# Patient Record
Sex: Female | Born: 1945
Health system: Southern US, Community
[De-identification: ages and names within clinical notes are randomized; demographics above are authoritative.]

## PROBLEM LIST (undated history)

## (undated) DIAGNOSIS — F419 Anxiety disorder, unspecified: Secondary | ICD-10-CM

## (undated) DIAGNOSIS — I10 Essential (primary) hypertension: Secondary | ICD-10-CM

## (undated) DIAGNOSIS — K469 Unspecified abdominal hernia without obstruction or gangrene: Secondary | ICD-10-CM

## (undated) DIAGNOSIS — I2510 Atherosclerotic heart disease of native coronary artery without angina pectoris: Secondary | ICD-10-CM

## (undated) DIAGNOSIS — E119 Type 2 diabetes mellitus without complications: Secondary | ICD-10-CM

## (undated) DIAGNOSIS — E039 Hypothyroidism, unspecified: Secondary | ICD-10-CM

## (undated) HISTORY — PX: CERVICAL FUSION: SHX112

## (undated) HISTORY — DX: Type 2 diabetes mellitus without complications: E11.9

## (undated) HISTORY — DX: Atherosclerotic heart disease of native coronary artery without angina pectoris: I25.10

## (undated) HISTORY — PX: KNEE SURGERY: SHX244

## (undated) HISTORY — PX: CHOLECYSTECTOMY: SHX55

## (undated) HISTORY — DX: Unspecified abdominal hernia without obstruction or gangrene: K46.9

## (undated) HISTORY — PX: ABDOMINAL HYSTERECTOMY: SHX81

## (undated) HISTORY — PX: UPPER GASTROINTESTINAL ENDOSCOPY: SHX188

## (undated) HISTORY — DX: Essential (primary) hypertension: I10

## (undated) HISTORY — PX: COLONOSCOPY: SHX174

## (undated) HISTORY — DX: Anxiety disorder, unspecified: F41.9

---

## 2000-04-28 ENCOUNTER — Ambulatory Visit: Admission: RE | Admit: 2000-04-28 | Discharge: 2000-04-28 | Payer: Self-pay | Admitting: Family Medicine

## 2001-03-19 ENCOUNTER — Encounter: Payer: Self-pay | Admitting: Neurosurgery

## 2001-03-24 ENCOUNTER — Ambulatory Visit (HOSPITAL_COMMUNITY): Admission: RE | Admit: 2001-03-24 | Discharge: 2001-03-25 | Payer: Self-pay | Admitting: Neurosurgery

## 2001-03-24 ENCOUNTER — Encounter: Payer: Self-pay | Admitting: Neurosurgery

## 2001-05-18 ENCOUNTER — Encounter: Admission: RE | Admit: 2001-05-18 | Discharge: 2001-05-18 | Payer: Self-pay | Admitting: Neurosurgery

## 2001-05-18 ENCOUNTER — Encounter: Payer: Self-pay | Admitting: Neurosurgery

## 2005-03-11 ENCOUNTER — Ambulatory Visit: Payer: Self-pay | Admitting: Urgent Care

## 2005-03-12 ENCOUNTER — Ambulatory Visit (HOSPITAL_COMMUNITY): Admission: RE | Admit: 2005-03-12 | Discharge: 2005-03-12 | Payer: Self-pay | Admitting: Internal Medicine

## 2005-03-12 ENCOUNTER — Ambulatory Visit: Payer: Self-pay | Admitting: Internal Medicine

## 2005-05-01 ENCOUNTER — Ambulatory Visit: Payer: Self-pay | Admitting: Internal Medicine

## 2006-05-18 ENCOUNTER — Ambulatory Visit: Payer: Self-pay | Admitting: Internal Medicine

## 2006-07-01 ENCOUNTER — Ambulatory Visit: Payer: Self-pay | Admitting: Internal Medicine

## 2010-04-15 ENCOUNTER — Ambulatory Visit (INDEPENDENT_AMBULATORY_CARE_PROVIDER_SITE_OTHER): Payer: Self-pay | Admitting: Internal Medicine

## 2010-06-04 NOTE — Assessment & Plan Note (Signed)
Melanie Mathews, ENGEBRETSEN                CHART#:  [id number]   DATE:  07/01/2006                       DOB:  [birth year]   PRESENTING COMPLAINT:  Persistent anorectal pain.   SUBJECTIVE:  The patient is a 65 year old Caucasian female who was seen  in our office on 05/18/06. She is noted to have sentinel skin tag and  inflammation of the anal canal and there was question of anal fissure.  She was given AnaMantle HC Forte and nitroglycerin. She says the  AnaMantle has helped her but nitroglycerin did not. She is only using  AnaMantle at bedtime. She says she is only slightly better. She has  aching most of the time but she has excruciating pain when her bowels  move and it may last for several minutes to a couple of hours. She still  notices a small amount of fresh blood with her bowel movements. However,  she has not had diarrhea lately. Similarly, she denies constipation. She  states her heartburn is well controlled with therapy. However, if she  misses a dose she has bad heartburn same day.   MEDICATIONS:  1. She is on Byetta 10 mcg subcutaneously b.i.d.  2. Metformin 1 gram b.i.d.  3. Metoclopramide 4 mg ACN.  4. Dyazide 1 daily.  5. Levoxyl 112 mcg daily.  6. Levbid 1 tablet q.a.m.  7. Metoprolol XL 100 mg daily.  8. Simvastatin 80 mg daily.  9. Effexor XR 150 mg daily.  10.Klor-Con 10 mEq daily.  11.Nexium 40 mg q.a.m.  12.Ramipril 5 mg daily.  13.Centrum Silver daily.  14.Calcium 600 mg daily.  15.AnaMantle HC per rectum at bedtime.  16.Nitroglycerin 0.2% p.r.n.   OBJECTIVE:  VITAL SIGNS: Weight 229.5 pounds. She is 5 feet 7.5 inches  tall, pulse 88 per minute, blood pressure 120/80, temp is 98.7.  HEENT: Conjunctivae are pink. Sclerae nonicteric. Oropharyngeal mucosa  is normal.  NECK: No neck masses are noted.  ABDOMEN: The abdomen is protuberant but soft and nontender with no  organomegaly or masses.  RECTAL: Examination was limited to external inspection. She has  sentinel  tag interiorly. There is a small sentinel skin tag posteriorly and there  is questionable ulcer above that. This area was difficult to see on  direct exam.   ASSESSMENT:  1. Anorectal discomfort. Her symptoms are typical of a fissure in ano.      She can try symptomatic therapy for another 2 weeks. If she does      not get better I am afraid she will need exam under anesthesia,      internal sphincterotomy.  2. Chronic gastroesophageal reflux disease. She is doing well with      therapy.   PLAN:  1. New prescription given for AnaMantle [facility] Forte that she applies once      or twice daily. She will also try sitz baths and make sure that her      stools are soft.  2. If she does not feel better in the next 2 weeks, she will make an      appointment to see Dr. Cleotis Nipper.  3. She will continue her Nexium as before, but she can discontinue      nitro ointment.       Lionel December, M.D.  Electronically Signed     NR/MEDQ  D:  07/01/2006  T:  07/02/2006  Job:  [id number]   cc:   Vangie Bicker. Cleotis Nipper, M.D.

## 2010-06-07 NOTE — Op Note (Signed)
[name]  Patient:    Melanie Mathews, Melanie Mathews Visit Number: [id number] MRN: [id number]          Service Type: DSU Location: [facility] 3172 [id number] Attending Physician:  Donn Pierini Dictated by:   Julio Sicks, M.D. Proc. Date: 03/24/01 Admit Date:  03/24/2001                             Operative Report  PREOPERATIVE DIAGNOSIS:  C6-7 central herniated nucleus pulposus with stenosis and myelopathy.  POSTOPERATIVE DIAGNOSIS:  C6-7 central herniated nucleus pulposus with stenosis and myelopathy.  OPERATION PERFORMED:  C6-7 anterior cervical diskectomy and fusion with allograft and anterior plating.  SURGEON:  Julio Sicks, M.D.  ASSISTANT:  Donalee Citrin, Montez Hageman.  ANESTHESIA:  General endotracheal.  INDICATIONS FOR PROCEDURE:  The patient is a 65 year old female with a history of bilateral upper extremity pain, paresthesias and weakness with failure of conservative management.  These symptoms are consistent with early cervical myelopathy.  She also has some degree of coexistent carpal tunnel syndrome. MRI scan demonstrates a central disk herniation at C6-7 with spinal cord compression.  We discussed the options available for management including the possibility of undergoing a C6-7 anterior cervical diskectomy and fusion with allograft and anterior plating for hopeful improvement in her symptoms.  She realizes that at a later time her carpal tunnel syndrome may also need to be addressed.  DESCRIPTION OF PROCEDURE:  The patient was taken to the operating room and placed on the operating table in supine position.  After an adequate level of anesthesia was achieved, the patient was positioned supine with the neck slightly extended and held in place with halter traction.  The patients anterior cervical region was shaved and prepped sterilely.  A 10 blade was used to make a linear incision overlying the C6-7 level.  This was carried down sharply to the platysma.   The platysma was then divided vertically and dissection proceeded along the medial border of the sternocleidomastoid muscle and carotid sheath.  The trachea and esophagus were mobilized and retracted towards the left.  Prevertebral fascia was stripped off the anterior spinal column.  The longus colli muscles were then elevated bilaterally using electrocautery.  Deep self-retaining retractor was placed.  Intraoperative fluoroscopy was used and the C6-7 level was confirmed.  The disk space was then incised with a 15 blade in a rectangular fashion.  A wide disk space clean-out was then achieved using pituitary rongeurs, forward and backward angled Carlens curets, Kerrison rongeurs and a high speed drill.  All elements of the disk were removed down to the posterior annulus.  Microscope was brought into the field and used throughout the remainder of the diskectomy. The remaining aspects of annulus and osteophytes were removed down to the level of the posterior longitudinal ligament.  The posterior longitudinal ligament was then elevated and resected in piecemeal fashion using Kerrison rongeurs.  The underlying thecal sac was identified.  A wide central decompression was then performed using Kerrison rongeurs to undercut the bodies of C6 and C7 and completely remove all elements of the disk to decompress the spinal cord.  Decompression was then proceeded out to the left sided C7 foramen.  The C7 nerve root was identified proximally and found to be free of any compression.  Decompression then proceeded out to the right sided C7 foramen.  Once again the C7 nerve root was identified proximally and decompressed within its  foramen.  At this point a blunt probe was passed easily both superiorly and inferiorly out each foramen.  There was no evidence of any residual stenosis.  The wound was then irrigated with antibiotic solution.  Gelfoam was placed topically for hemostasis which was found to be good.   An 8 mm patellar wedge allograft was impacted into place and recessed approximately 1 mm from the anterior cortical surface.  A 25 mm Atlantis anterior cervical plate was then placed over the C6 and C7 levels.  It was then attached under fluoroscopic guidance by using two 13 mm variable angle screws at both C6 and at C7.  All four screws were given a final tightening and found to be solidly within bone.  Locking screws were engaged at both levels.  Final images revealed good position of the bone grafts and hardware at the proper operative level with normal alignment of the spine.  Retraction system was removed.  Hemostasis was achieved with bipolar electrocautery.  The wound was then closed in a typical fashion.  Steri-Strips and dry sterile dressing were applied.  There were no apparent complications.   The patient tolerated the procedure well and she returned to the recovery room postoperatively. Dictated by:   Julio Sicks, M.D. Attending Physician:  Donn Pierini DD:  03/24/01 TD:  03/24/01 Job: [id number]

## 2010-06-07 NOTE — Consult Note (Signed)
NAME:  Melanie Mathews, Melanie Mathews               ACCOUNT NO.:  [id number]   MEDICAL RECORD NO.:  [id number]          PATIENT TYPE:  AMB   LOCATION:  DAY                           FACILITY:  [facility]   PHYSICIAN:  Lionel December, M.D.    DATE OF BIRTH:  27-Feb-1945   DATE OF CONSULTATION:  DATE OF DISCHARGE:                                   CONSULTATION   GASTROENTEROLOGY CONSULTATION   REFERRING PHYSICIAN:  Dr. Dimas Aguas.   REASON FOR CONSULTATION:  Chronic refractory gastroesophageal reflux type  symptoms.   HISTORY OF PRESENT ILLNESS:  [name] is a 65 year old Caucasian female  who was recently worked up for left upper anterior chest pain that radiated  down her left arm. She was seen in the emergency room. She was diagnosed  with chest wall pain.  She continues to complain of intermittent aching  which usually lasts for several hours.  Movement definitely makes the pain  worse.  She also has a history of chronic gastroesophageal reflux disease.  She has been on Nexium for years. She has break through heartburn. She does  have daily nausea and vomiting, usually worse in the evening.  She is  complaining of a significant amount of water brash as well as a thick mucous  at the back of her throat. She is having some choking with liquids.  She  denies any solid food dysphagia. She feels that she does have pain or spasms  in her mid esophagus while swallowing liquids. She denies any anorexia,  denies any early satiety. She has a history of irritable bowel syndrome. She  takes Levbid daily. She generally has diarrhea-predominant symptoms.  She  has generally around 2 to 3 bowel movements a day. She complains of  proctalgia. She has a history of rectal fissures.  She did have normal  colonoscopy by Dr. Cleotis Nipper in October 2006.  She states she has been  exposed to Clostridium difficile through her Mom at the nursing home,  however, she notes her diarrhea is no more significant than it usually is.   PAST MEDICAL HISTORY:  Hypothyroidism, chronic GERD, depression,  hypertension, hypercholesterolemia, irritable bowel syndrome, diabetes  mellitus, rectal fissure, diverticulitis in fall 2006, colonoscopy by Dr.  Cleotis Nipper in October 2006 which showed diverticulosis per patient,  cholecystectomy five years ago, complete hysterectomy, cervical disk surgery  and hemorrhoidectomy.  Patient had esophagogastroduodenoscopy by Dr. Karilyn Cota  on August 30, 1997 and had a moderate sized sliding hiatal hernia with  changes of reflux esophagitis limited to the herniated part of the stomach,  a small gastric polyp, antral gastritis and a prominent EG junction which  was biopsied at four points in case she had an inconspicuous ring.  Gastric  biopsy showed mild chronic gastritis.   CURRENT MEDICATIONS:  1.  Levoxyl 112 mcg daily.  2.  Levbid one-half to one p.o. daily, PRN.  3.  K-Dur 10 mEq daily.  4.  Altace 5 mg daily.  5.  Effexor XR 75 mg daily.  6.  Zocor 80 mg daily.  7.  Nexium 40 mg daily.  8.  Toprol XL 100 mg daily.  9.  Metformin 1,000 mg b.i.d.  10. Maxzide/ hydrochlorothiazide 25/37.5 mg daily.   ALLERGIES:  SULFA.   FAMILY HISTORY:  Positive for father deceased at age 56 with pancreatic  carcinoma.  Mother age 71 is relatively healthy. She has multiple siblings,  all of whom are relatively healthy.   SOCIAL HISTORY:  [name] has been married for 38 years. She has two  grown healthy children.  She is employed full time with [employer].  She has a  remote tobacco use history. She denies alcohol or drug use.   REVIEW OF SYSTEMS:  CONSTITUTIONAL:  Her weight is steadily increasing.  Denies any fever or chills.  She does complain of hot flashes occasionally.  CARDIOVASCULAR:  See history of present illness.  PULMONARY:  Denies any  shortness of breath, dyspnea, cough, hemoptysis  GASTROINTESTINAL:  See  history of present illness.  She denies any rectal bleeding or melena.   ENDOCRINE:  She has history of diabetes mellitus.  She had a TSH checked in  January given history of hypothyroidism, which was normal.   PHYSICAL EXAMINATION:  VITAL SIGNS:  Weight 267 pounds, height 67-1/2  inches. Temperature 98.6, blood pressure 142/80, pulse 80.  GENERAL APPEARANCE:  [name] is a 65 year old obese Caucasian female who  is alert, oriented, pleasant and cooperative and in no acute distress.  HEENT:  Sclerae clear, nonicteric.  Conjunctiva pink.  Oropharynx pink and  moist without any lesions.  NECK: Supple without masses or thyromegaly.  CHEST:  Heart regular rate and rhythm with normal S1, S2, without any  murmurs, clicks, rubs or gallops.  Her upper anterior chest is nontender to  palpation.  ABDOMEN:  Positive bowel sounds X4.  No bruits auscultated.  Soft,  nondistended. She does have mild right upper quadrant tenderness on deep  palpation.  No rebound tenderness or guarding.  She has right upper quadrant  scars suggestive of previous cholecystectomy.  EXTREMITIES:  With trace lower extremity edema bilateral.  She does have  pain with left shoulder rotation and when she has adduction of the left  shoulder anteriorly. Left and right shoulder movement reproduces her pain.  Her upper anterior chest is nontender to palpation.  SKIN:  Her skin is pink, warm and dry without any rashes or jaundice.   IMPRESSION:  [name] is a 65 year old Caucasian female with history of  chronic gastroesophageal reflux disease. She has been on Nexium for several  years.  She has had some refractory heartburn, nausea, vomiting, water brash  lately. She has also had mid esophageal dysphagia with liquids and points to  her mid esophageal region.  She is having frequent choking spells.  She also  had some left upper anterior chest pain which radiates down her left arm.  The pain is reproduced with range of motion of the left shoulder.  I suspect she may have musculoskeletal  component to this pain, however, she definitely  has refractory gastroesophageal reflux disease symptoms as well.  Therefore,  I feel further evaluation is necessary to rule out erosive esophagitis and  complicating gastroesophageal reflux disease including the development of  web, ring or stricture.  She has history of irritable bowel syndrome which  is well controlled at this time.  She also has history of diverticulosis and  has recovered well from a bout of diverticulitis last fall.   PLAN:  1.  Esophagogastroduodenoscopy with possible esophageal dilatation by Dr.  Rehman in the near future.  I have discussed this procedure including      risks and benefits which include, but are not limited to, bleeding,      infection, perforation, drug reaction.  She agrees with this plan and      consent will be obtained.  2.  Will obtain colonoscopy report from Dr. Verne Grain office from October      of 2006.  3.  Benefiber samples given.  4.  She is to continue Nexium 40 mg daily or b.i.d. if needed.  5.  I have offered pain medications for her chest pain.  She has declined.      In the meantime she is going to take Tylenol Arthritis.   We would like to thank Dr. Dimas Aguas for allowing [place] to participate in the care  of Ms. Clapper.      Nicholas Lose, N.P.      Lionel December, M.D.  Electronically Signed    KC/MEDQ  D:  03/12/2005  T:  03/12/2005  Job:  [id number]   cc:   Dr. Dimas Aguas

## 2010-06-07 NOTE — Op Note (Signed)
Melanie Mathews, Melanie Mathews               ACCOUNT NO.:  [id number]   MEDICAL RECORD NO.:  [id number]          PATIENT TYPE:  AMB   LOCATION:  DAY                           FACILITY:  [facility]   PHYSICIAN:  Lionel December, M.D.    DATE OF BIRTH:  02-19-1945   DATE OF PROCEDURE:  03/12/2005  DATE OF DISCHARGE:                                 OPERATIVE REPORT   PROCEDURE:  Esophagogastroduodenoscopy with esophageal dilation.   INDICATIONS:  [name] is a 65 year old Caucasian female with chronic GERD who  is having dysphagia to solids as well as liquids. She is undergoing  diagnostic and therapeutic procedure. Procedure and risks were reviewed with  the patient, and informed consent was obtained.   MEDICINES FOR CONSCIOUS SEDATION:  Cetacaine spray for oropharyngeal topical  anesthesia Demerol 50 mg IV Versed 8 mg IV.   FINDINGS:  Procedure performed in endoscopy suite. The patient's vital signs  and O2 saturations were monitored during the procedure and remained stable.  The patient was placed in the left lateral recumbent position and the  Olympus videoscope was passed via oropharynx without any difficulty into  esophagus.   ESOPHAGUS:  Mucosa of the esophagus was normal throughout. The proximal  segment was carefully examined and did not see Zenker's diverticulum. GE  junction was at 40 cm from the incisors, but there was no ring or stricture  noted.   STOMACH:  Multiple polyps were noted at gastric body. The largest of which  was about 10 mm. Endoscopically these were suspicious for hyperplastic  polyps. Biopsy was taken from 4 of these and submitted in 1 container.  Antral mucosa revealed erythema and granularity but no erosions or ulcers  were noted. Pyloric channel as well as angularis, fundus, and cardia were  examined by retroflexing scope and were normal.   DUODENUM:  Bulbar mucosa was normal. Scope was passed into the second part  of duodenum where the mucosa and folds were normal.  Endoscope was withdrawn.   Esophagus was dilated by passing 56-French Maloney dilator to full  insertion. As the dilator was withdrawn, the endoscope was passed, again,  and no mucosal disruption was noted in the esophagus. Endoscope was  withdrawn. The patient tolerated the procedure well.   FINAL DIAGNOSIS:  1.  No evidence of esophagitis, ring or stricture noted, but esophagus      dilated by passing 56-French Maloney dilator given history of dysphagia.  2.  Multiple polyps at gastric body some of which were biopsied for      histology.  3.  Nonerosive antral gastritis.   RECOMMENDATIONS:  1.  Antireflux measures reinforced. Will increase her Nexium to 40 mg p.o.      b.i.d. She will pick up samples from      the office to supplement her prescription.  2.  H. pylori serology.  3.  I will be contacting the patient with results of biopsy and H. pylori;      and with further recommendations, if any.      Lionel December, M.D.  Electronically Signed     NR/MEDQ  D:  03/13/2005  T:  03/13/2005  Job:  [id number]   cc:   Selinda Flavin  Fax: [phone]

## 2011-04-16 DIAGNOSIS — R0602 Shortness of breath: Secondary | ICD-10-CM

## 2011-05-15 ENCOUNTER — Other Ambulatory Visit (INDEPENDENT_AMBULATORY_CARE_PROVIDER_SITE_OTHER): Payer: Self-pay | Admitting: Internal Medicine

## 2011-06-09 ENCOUNTER — Encounter (INDEPENDENT_AMBULATORY_CARE_PROVIDER_SITE_OTHER): Payer: Self-pay | Admitting: *Deleted

## 2011-06-09 ENCOUNTER — Encounter (INDEPENDENT_AMBULATORY_CARE_PROVIDER_SITE_OTHER): Payer: Self-pay | Admitting: Internal Medicine

## 2011-06-09 ENCOUNTER — Ambulatory Visit (INDEPENDENT_AMBULATORY_CARE_PROVIDER_SITE_OTHER): Payer: BC Managed Care – PPO | Admitting: Internal Medicine

## 2011-06-09 VITALS — BP 128/76 | HR 80 | Temp 98.8°F | Resp 20 | Ht 67.0 in | Wt 249.2 lb

## 2011-06-09 DIAGNOSIS — I10 Essential (primary) hypertension: Secondary | ICD-10-CM | POA: Insufficient documentation

## 2011-06-09 DIAGNOSIS — K219 Gastro-esophageal reflux disease without esophagitis: Secondary | ICD-10-CM | POA: Insufficient documentation

## 2011-06-09 DIAGNOSIS — K921 Melena: Secondary | ICD-10-CM

## 2011-06-09 DIAGNOSIS — K602 Anal fissure, unspecified: Secondary | ICD-10-CM | POA: Insufficient documentation

## 2011-06-09 DIAGNOSIS — E119 Type 2 diabetes mellitus without complications: Secondary | ICD-10-CM | POA: Insufficient documentation

## 2011-06-09 DIAGNOSIS — E785 Hyperlipidemia, unspecified: Secondary | ICD-10-CM | POA: Insufficient documentation

## 2011-06-09 DIAGNOSIS — R1319 Other dysphagia: Secondary | ICD-10-CM | POA: Insufficient documentation

## 2011-06-09 DIAGNOSIS — E1169 Type 2 diabetes mellitus with other specified complication: Secondary | ICD-10-CM | POA: Insufficient documentation

## 2011-06-09 MED ORDER — NITROGLYCERIN 0.4 % RE OINT: 1.0000 [in_us] | TOPICAL_OINTMENT | Freq: Two times a day (BID) | RECTAL | Status: DC

## 2011-06-09 MED ORDER — NYSTATIN-TRIAMCINOLONE 100000-0.1 UNIT/GM-% EX CREA: TOPICAL_CREAM | Freq: Two times a day (BID) | CUTANEOUS | Status: AC

## 2011-06-09 NOTE — Patient Instructions (Signed)
Colonoscopy to be scheduled. Notify if you have any side effects with nitroglycerin ointment.

## 2011-06-09 NOTE — Progress Notes (Signed)
PRESENTING COMPLAINT: Dysphagia, rectal pain, and hematochezia.  HISTORY OF PRESENT ILLNESS: Melanie Mathews is 66 year old Caucasian female  patient of Dr. Selinda Flavin, who is here for scheduled visit. She was  last seen by me in [place] office in February 2011. She had an office  visit last year, but had to be canceled because of family emergency.  She continues to experience dysphagia primarily to solids. She has  sought of learnt to live with it. Food bolus always passes down. She  has not had any episode of food impaction. She experiences heartburn no  more than once or twice a week with certain foods. However, if she  misses a pill, she experiences severe heartburn the same day. She has  been previously evaluated for this problem and felt to have motility  disorder. She declined to have manometry.  Today, she is also complaining of rectal pain. She describes this  fluctuating when her bowels move. She states she has to push on either  side of her anal opening in order to facilitate defecation. She feels  that she has obstruction in this area. She also has noted intermittent  hematochezia with passage of small to moderate amount of bright red  blood per rectum. She has history of diarrhea, but lately her stool has  been normal. She generally has 2 per day. Her appetite is normal. She  has gained 12 pounds since the last visit of February 2011. She denies  abdominal pain, nausea, vomiting, hoarseness, chronic cough, or sore  throat.  CURRENT MEDICATIONS:  1. Zetia 10 mg p.o. daily.  2. Furosemide 40 mg p.o. q.a.m.  3. Gabapentin 800 mg p.o. q.a.m. and 1.6 g p.o. at bedtime.  4. Levemir 35 units subcu in a.m. and 40 units in the p.m.  5. Levothyroxine 137 mcg p.o. daily.  6. Linagliptin and metformin 2.05/998 mg p.o. q.a.m.  7. Victoza 18 mg subcu at bedtime.  8. Metoprolol 50 mg p.o. daily.  9. MVI with minerals 1 daily.  10.Nexium 40 mg p.o. q.a.m.  11.Simvastatin 80 mg p.o. daily.    12.Micardis HCT 80/25 daily.  13.Ultracet 37.5/325 two tablets p.o. at bedtime.  14.Effexor XR 150 mg p.o. daily.  PAST MEDICAL HISTORY:  1. She has chronic GERD. She has undergone multiple endoscopies more  recently in March 2011 at [facility] in [place]. She had few hyperplastic-  appearing gastric polyps, otherwise normal EGD and she did not  respond to esophageal dilation with 56-French [facility] dilator.  Barium study had suggested motility disorder.  2. Patient's initial colonoscopy was in 1996, and her last colonoscopy  was in September 2006 by Dr. Cleotis Nipper, revealing external  hemorrhoids and inflammation of the anal canal, and mild  diverticulosis of sigmoid colon. Her CLOtest in the past, it has  been negative. She has hypothyroidism.  3. Hypertension.  4. Hyperlipidemia.  5. Obesity.  She had hysterectomy several years ago. She had cholecystectomy in  1998. She had neck surgery, disk disease in March 2003, left knee  arthroscopy in 2008. She has been diabetic for about 8 years now.  ALLERGIES: To SULFA.  FAMILY HISTORY: Father died of pancreatic carcinoma within year of  diagnosis at age 33. Mother lived to be 32. She has 5 sisters, 2 are  diabetic, 1 sister has UC. She had 1 brother with multiple medical  problems including diabetes, CAD, and PVD, and died last year of lung  carcinoma.  SOCIAL HISTORY: She is married. She is still working at [employer] where she has been for more than 30 years. She has a son in  good health. Her daughter is 30 year old. 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 She has trach and PEG  and she is cared for at home. She has never smoked cigarettes or drank  alcohol.  PHYSICAL EXAMINATION: VITAL SIGNS: Weight 249.2 pounds she is 67  inches tall, pulse 80 per minute, blood pressure 128/76, respirations  20.  HEENT: Conjunctivae is pink. Sclerae nonicteric. Oropharyngeal mucosa  is normal. No neck masses or  thyromegaly noted.  CARDIAC: With regular rhythm. Normal S1, S2. No murmur or gallop  noted.  LUNGS: Clear to auscultation.  ABDOMEN: Full. Bowel sounds are normal. Soft with mild tenderness at  RLQ in epigastric region. No organomegaly or masses.  RECTAL: Reveals sentinel skin tag anteriorly. There appears to be  fissure posteriorly. Digital exam reveals increased sphincter tone. No  mass noted. Scant amount of stool in the wall which is guaiac negative.  EXTREMITIES: No peripheral edema or clubbing noted.  ASSESSMENT:  1. Dysphagia. This is a chronic symptom felt to be due to esophageal  motility disorder. She seemed to have learnt to live with it. If  this symptoms get worse, she will need to have esophageal  manometry. She has undergone EGD with dilation in March 2011,  without any benefit. She also has had barium pill esophagogram.  2. Chronic gastroesophageal reflux disease. Heartburn is reasonably  well-controlled.  3. Rectal pain and intermittent hematochezia. Suspect she has  fissure. Need to rule out other lesions. Last colonoscopy was in  September 2006.  RECOMMENDATIONS:  1. Anti-reflux measures reinforced. She will continue Nexium at current dose.  2. Nitro ointment 0.4% 1 inch application to anal canal b.i.d.  3. Mycolog cream to be applied on the outside b.i.d.  4. We will schedule for diagnostic colonoscopy in near future.

## 2011-06-10 ENCOUNTER — Telehealth (INDEPENDENT_AMBULATORY_CARE_PROVIDER_SITE_OTHER): Payer: Self-pay | Admitting: *Deleted

## 2011-06-10 ENCOUNTER — Other Ambulatory Visit (INDEPENDENT_AMBULATORY_CARE_PROVIDER_SITE_OTHER): Payer: Self-pay | Admitting: *Deleted

## 2011-06-10 DIAGNOSIS — K921 Melena: Secondary | ICD-10-CM

## 2011-06-10 MED ORDER — PEG-KCL-NACL-NASULF-NA ASC-C 100 G PO SOLR: 1.0000 | Freq: Once | ORAL | Status: DC

## 2011-06-10 NOTE — Telephone Encounter (Signed)
This encounter was created in error - please disregard.

## 2011-06-10 NOTE — Telephone Encounter (Signed)
Patient needs movi prep 

## 2011-06-10 NOTE — H&P (Signed)
NAME:  Mathews Mathews                    ACCOUNT NO.:  MEDICAL RECORD NO.:  [id number]  LOCATION:                                 FACILITY:  PHYSICIAN:  Lionel December, M.D.    DATE OF BIRTH:  17-Aug-1945  DATE OF ADMISSION: DATE OF DISCHARGE:  LH                             HISTORY & PHYSICAL   PRESENTING COMPLAINT:  Dysphagia, rectal pain, and hematochezia.  HISTORY OF PRESENT ILLNESS:  Melanie Mathews is 66 year old Caucasian female patient of Dr. Selinda Mathews, who is here for scheduled visit.  She was last seen by me in [place] office in February 2011.  She had an office visit last year, but had to be canceled because of family emergency.  She continues to experience dysphagia primarily to solids.  She has sought of learnt to live with it.  Food bolus always passes down.  She has not had any episode of food impaction.  She experiences heartburn no more than once or twice a week with certain foods.  However, if she misses a pill, she experiences severe heartburn the same day.  She has been previously evaluated for this problem and felt to have motility disorder.  She declined to have manometry. Today, she is also complaining of rectal pain.  She describes this fluctuating when her bowels move.  She states she has to push on either side of her anal opening in order to facilitate defecation.  She feels that she has obstruction in this area.  She also has noted intermittent hematochezia with passage of small to moderate amount of bright red blood per rectum.  She has history of diarrhea, but lately her stool has been normal.  She generally has 2 per day.  Her appetite is normal.  She has gained 12 pounds since the last visit of February 2011.  She denies abdominal pain, nausea, vomiting, hoarseness, chronic cough, or sore throat.  CURRENT MEDICATIONS: 1. Zetia 10 mg p.o. daily. 2. Furosemide 40 mg p.o. q.a.m. 3. Gabapentin 800 mg p.o. q.a.m. and 1.6 g p.o. at bedtime. 4. Levemir 35 units  subcu in a.m. and 40 units in the p.m. 5. Levothyroxine 137 mcg p.o. daily. 6. Linagliptin and metformin 2.05/998 mg p.o. q.a.m. 7. Victoza 18 mg subcu at bedtime. 8. Metoprolol 50 mg p.o. daily. 9. MVI with minerals 1 daily. 10.Nexium 40 mg p.o. q.a.m. 11.Simvastatin 80 mg p.o. daily. 12.Micardis HCT 80/25 daily. 13.Ultracet 37.5/325 two tablets p.o. at bedtime. 14.Effexor XR 150 mg p.o. daily.  PAST MEDICAL HISTORY: 1. She has chronic GERD.  She has undergone multiple endoscopies more     recently in March 2011 at [facility] in [place].  She had few hyperplastic-     appearing gastric polyps, otherwise normal EGD and she did not     respond to esophageal dilation with [facility] dilator.     Barium study had suggested motility disorder. 2. Patient's initial colonoscopy was in 1996, and her last colonoscopy     was in September 2006 by Dr. Cleotis Nipper, revealing external     hemorrhoids and inflammation of the anal canal, and mild     diverticulosis of sigmoid colon.  Her CLOtest in the past, it has     been negative.  She has hypothyroidism. 3. Hypertension. 4. Hyperlipidemia. 5. Obesity.  She had hysterectomy several years ago.  She had cholecystectomy in 1998.  She had neck surgery, disk disease in March 2003, left knee arthroscopy in 2008.  She has been diabetic for about 8 years now.  ALLERGIES:  To SULFA.  FAMILY HISTORY:  Father died of pancreatic carcinoma within year of diagnosis at age 25.  Mother lived to be 26.  She has 5 sisters, 2 are diabetic, 1 sister has UC.  She had 1 brother with multiple medical problems including diabetes, CAD, and PVD, and died last year of lung carcinoma.  SOCIAL HISTORY:  She is married.  She is still working at [employer] where she has been for more than 30 years.  She has a son in good health.  Her daughter is 24 year old.  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  She has trach and PEG and she is  cared for at home.  She has never smoked cigarettes or drank alcohol.  PHYSICAL EXAMINATION:  VITAL SIGNS:  Weight 249.2 pounds she is 67 inches tall, pulse 80 per minute, blood pressure 128/76, respirations 20. HEENT:  Conjunctivae is pink.  Sclerae nonicteric.  Oropharyngeal mucosa is normal.  No neck masses or thyromegaly noted. CARDIAC:  With regular rhythm.  Normal S1, S2.  No murmur or gallop noted. LUNGS:  Clear to auscultation. ABDOMEN:  Full.  Bowel sounds are normal.  Soft with mild tenderness at RLQ in epigastric region.  No organomegaly or masses. RECTAL:  Reveals sentinel skin tag anteriorly.  There appears to be fissure posteriorly.  Digital exam reveals increased sphincter tone.  No mass noted.  Scant amount of stool in the wall which is guaiac negative. EXTREMITIES:  No peripheral edema or clubbing noted.  ASSESSMENT: 1. Dysphagia.  This is a chronic symptom felt to be due to esophageal     motility disorder.  She seemed to have learnt to live with it.  If     this symptoms get worse, she will need to have esophageal     manometry.  She has undergone EGD with dilation in March 2011,     without any benefit.  She also has had barium pill esophagogram. 2. Chronic gastroesophageal reflux disease.  Heartburn is reasonably     well-controlled. 3. Rectal pain and intermittent hematochezia.  Suspect she has     fissure.  Need to rule out other lesions.  Last colonoscopy was in     September 2006.  RECOMMENDATIONS: 1. Anti-reflux measures reinforced.  She will continue Nexium at     current dose. 2. Nitro ointment 0.4% 1 inch application to anal canal b.i.d. 3. Mycolog cream to be applied on the outside b.i.d. 4. We will schedule for diagnostic colonoscopy in near future.                                           ______________________________ Lionel December, M.D.     NR/MEDQ  D:  06/09/2011  T:  06/10/2011  Job:  [id number]  cc:   Melanie Flavin, MD Fax: [phone]

## 2011-06-26 ENCOUNTER — Encounter (HOSPITAL_COMMUNITY): Payer: Self-pay | Admitting: Pharmacy Technician

## 2011-07-02 MED ORDER — SODIUM CHLORIDE 0.45 % IV SOLN
Freq: Once | INTRAVENOUS | Status: AC
  Administered 2011-07-03: 1000 mL via INTRAVENOUS

## 2011-07-03 ENCOUNTER — Encounter (HOSPITAL_COMMUNITY): Payer: Self-pay | Admitting: *Deleted

## 2011-07-03 ENCOUNTER — Encounter (HOSPITAL_COMMUNITY)
Admission: RE | Disposition: A | Discharge status: Home / Self Care | Payer: Self-pay | Source: Ambulatory Visit | Attending: Internal Medicine

## 2011-07-03 ENCOUNTER — Ambulatory Visit (HOSPITAL_COMMUNITY)
Admission: RE | Admit: 2011-07-03 | Discharge: 2011-07-03 | Disposition: A | Discharge status: Home / Self Care | Payer: BC Managed Care – PPO | Source: Ambulatory Visit | Attending: Internal Medicine | Admitting: Internal Medicine

## 2011-07-03 DIAGNOSIS — K5730 Diverticulosis of large intestine without perforation or abscess without bleeding: Secondary | ICD-10-CM | POA: Insufficient documentation

## 2011-07-03 DIAGNOSIS — I10 Essential (primary) hypertension: Secondary | ICD-10-CM | POA: Insufficient documentation

## 2011-07-03 DIAGNOSIS — Z01812 Encounter for preprocedural laboratory examination: Secondary | ICD-10-CM | POA: Insufficient documentation

## 2011-07-03 DIAGNOSIS — Z79899 Other long term (current) drug therapy: Secondary | ICD-10-CM | POA: Insufficient documentation

## 2011-07-03 DIAGNOSIS — K6289 Other specified diseases of anus and rectum: Secondary | ICD-10-CM

## 2011-07-03 DIAGNOSIS — K921 Melena: Secondary | ICD-10-CM | POA: Insufficient documentation

## 2011-07-03 DIAGNOSIS — K644 Residual hemorrhoidal skin tags: Secondary | ICD-10-CM | POA: Insufficient documentation

## 2011-07-03 DIAGNOSIS — E119 Type 2 diabetes mellitus without complications: Secondary | ICD-10-CM | POA: Insufficient documentation

## 2011-07-03 DIAGNOSIS — Z794 Long term (current) use of insulin: Secondary | ICD-10-CM | POA: Insufficient documentation

## 2011-07-03 HISTORY — PX: COLONOSCOPY: SHX5424

## 2011-07-03 HISTORY — DX: Hypothyroidism, unspecified: E03.9

## 2011-07-03 LAB — GLUCOSE, CAPILLARY: Glucose-Capillary: 146 mg/dL — ABNORMAL HIGH (ref 70–99)

## 2011-07-03 SURGERY — COLONOSCOPY
Anesthesia: Moderate Sedation

## 2011-07-03 MED ORDER — MIDAZOLAM HCL 5 MG/5ML IJ SOLN
INTRAMUSCULAR | Status: AC
  Filled 2011-07-03: qty 10

## 2011-07-03 MED ORDER — STERILE WATER FOR IRRIGATION IR SOLN
Status: DC | PRN
  Administered 2011-07-03: 10:00:00

## 2011-07-03 MED ORDER — LIDOCAINE HCL 2 % EX GEL
CUTANEOUS | Status: DC | PRN
  Administered 2011-07-03: 1 via TOPICAL

## 2011-07-03 MED ORDER — MIDAZOLAM HCL 5 MG/5ML IJ SOLN
INTRAMUSCULAR | Status: DC | PRN
  Administered 2011-07-03: 2 mg via INTRAVENOUS
  Administered 2011-07-03: 3 mg via INTRAVENOUS
  Administered 2011-07-03: 2 mg via INTRAVENOUS

## 2011-07-03 MED ORDER — LIDOCAINE HCL 2 % EX GEL
CUTANEOUS | Status: AC
  Filled 2011-07-03: qty 30

## 2011-07-03 MED ORDER — MEPERIDINE HCL 50 MG/ML IJ SOLN
INTRAMUSCULAR | Status: DC | PRN
  Administered 2011-07-03 (×2): 25 mg via INTRAVENOUS

## 2011-07-03 MED ORDER — MEPERIDINE HCL 50 MG/ML IJ SOLN
INTRAMUSCULAR | Status: AC
  Filled 2011-07-03: qty 1

## 2011-07-03 NOTE — Op Note (Signed)
COLONOSCOPY PROCEDURE REPORT  PATIENT:  Melanie Mathews  MR#:  [id number] Birthdate:  1945/02/13, 65 y.o., female Endoscopist:  Dr. Malissa Hippo, MD Referred By:  Dr. Selinda Flavin, MD. Procedure Date: 07/03/2011  Procedure:   Colonoscopy  Indications:  Patient is 66 year old Caucasian female who presents with intermittent rectal pain. She is undergoing diagnostic colonoscopy.  Informed Consent:  The procedure and risks were reviewed with the patient and informed consent was obtained.  Medications:  Demerol 50 mg IV Versed 7 mg IV  Description of procedure:  After a digital rectal exam was performed, that colonoscope was advanced from the anus through the rectum and colon to the area of the cecum, ileocecal valve and appendiceal orifice. The cecum was deeply intubated. These structures were well-seen and photographed for the record. From the level of the cecum and ileocecal valve, the scope was slowly and cautiously withdrawn. The mucosal surfaces were carefully surveyed utilizing scope tip to flexion to facilitate fold flattening as needed. The scope was pulled down into the rectum where a thorough exam including retroflexion was performed.  Findings:   Prep excellent. Scattered small diverticula at sigmoid colon. Normal rectal mucosa. Small hemorrhoids below the dentate line along with a small scar at dentate line.  Therapeutic/Diagnostic Maneuvers Performed:  None  Complications:  None  Cecal Withdrawal Time:  11 minutes  Impression:  Examination performed to cecum. Mild sigmoid colon diverticulosis. External hemorrhoids. Small scar at dentate line possibly healed fissure.  Recommendations:  Standard instructions given. She will discontinue nitroglycerin ointment application at end of this month. Next colonoscopy in 10 years    Brentlee Delage U  07/03/2011 10:39 AM  CC: Dr. Selinda Flavin, MD & Dr. No ref. provider found

## 2011-07-03 NOTE — H&P (Signed)
Melanie Mathews is an 66 y.o. female.   Chief Complaint: Patient is here for colonoscopy. HPI: Patient is 66 year old Caucasian female was at antrum and hematochezia and rectal pain. She has noted to improvement in rectal pain with combination of nitroglycerin ointment and Mycolog-II cream. She denies abdominal pain or change in her bowel habits. Details details of her history are summarized in my note from 06/10/2011 and will not be repeated.  Past Medical History  Diagnosis Date  . Diabetes mellitus   . Hernia   . Hypertension   . Anxiety   . Hypothyroidism     Past Surgical History  Procedure Date  . Colonoscopy   . Upper gastrointestinal endoscopy   . Cholecystectomy   . Cervical fusion   . Abdominal hysterectomy   . Knee surgery     Arthroscopic    Family History  Problem Relation Age of Onset  . Arthritis Mother   . Pancreatic cancer Father   . Arthritis Sister   . COPD Sister   . Diabetes Sister    Social History:  reports that she quit smoking about 20 years ago. Her smoking use included Cigarettes. She does not have any smokeless tobacco history on file. She reports that she does not drink alcohol or use illicit drugs.  Allergies:  Allergies  Allergen Reactions  . Sulfur Itching and Swelling  . Adhesive (Tape) Rash    Medications Prior to Admission  Medication Sig Dispense Refill  . ezetimibe (ZETIA) 10 MG tablet Take 10 mg by mouth daily.      . furosemide (LASIX) 40 MG tablet Take 40 mg by mouth daily.      Marland Kitchen gabapentin (NEURONTIN) 800 MG tablet Take 800-1,600 mg by mouth 2 (two) times daily. Patient states that she takes 1 by mouth in the morning, and 2 by mouth at night      . insulin detemir (LEVEMIR) 100 UNIT/ML injection Inject into the skin. 35 units in the morning , 40 units in the evening      . levothyroxine (SYNTHROID, LEVOTHROID) 137 MCG tablet Take 137 mcg by mouth daily.      . Linagliptin-Metformin HCl (JENTADUETO) 2.05-998 MG TABS Take 1  tablet by mouth daily.       . metoprolol (LOPRESSOR) 50 MG tablet Take 50 mg by mouth at bedtime.      . Multiple Vitamins-Minerals (MULTIVITAMIN PO) Take 1 tablet by mouth daily.       Marland Kitchen NEXIUM 40 MG capsule TAKE (1) CAPSULE ONCE DAILY  30 each  5  . Nitroglycerin 0.4 % OINT Place 1 inch rectally 2 (two) times daily.      . peg 3350 powder (MOVIPREP) 100 G SOLR Take 1 kit (100 g total) by mouth once.  1 kit  0  . potassium chloride (K-DUR,KLOR-CON) 10 MEQ tablet Take 10 mEq by mouth daily.      . simvastatin (ZOCOR) 80 MG tablet Take 80 mg by mouth at bedtime.      Marland Kitchen telmisartan-hydrochlorothiazide (MICARDIS HCT) 80-25 MG per tablet Take 1 tablet by mouth daily.      . traMADol-acetaminophen (ULTRACET) 37.5-325 MG per tablet Take 2 tablets by mouth at bedtime.      Marland Kitchen venlafaxine XR (EFFEXOR-XR) 150 MG 24 hr capsule Take 150 mg by mouth daily.      Marland Kitchen nystatin-triamcinolone (MYCOLOG II) cream Apply topically 2 (two) times daily.  60 g  0    Results for orders placed during the  hospital encounter of 07/03/11 (from the past 48 hour(s))  GLUCOSE, CAPILLARY     Status: Abnormal   Collection Time   07/03/11  9:44 AM      Component Value Range Comment   Glucose-Capillary 146 (*) 70 - 99 mg/dL    Comment 1 Documented in Chart      No results found.  ROS  Blood pressure 109/79, pulse 80, temperature 98.2 F (36.8 C), temperature source Oral, resp. rate 21, SpO2 96.00%. Physical Exam  Constitutional: She appears well-developed and well-nourished.  HENT:  Mouth/Throat: Oropharynx is clear and moist.  Eyes: Conjunctivae are normal. No scleral icterus.  Neck: No thyromegaly present.  Cardiovascular: Normal rate, regular rhythm and normal heart sounds.   No murmur heard. Respiratory: Effort normal and breath sounds normal.  GI: Soft. She exhibits no distension and no mass. There is no tenderness.  Musculoskeletal: She exhibits no edema.  Lymphadenopathy:    She has no cervical adenopathy.    Neurological: She is alert.  Skin: Skin is warm and dry.     Assessment/Plan Recurrent hematochezia. Diagnostic colonoscopy.  Kenyanna Grzesiak U 07/03/2011, 10:04 AM

## 2011-07-03 NOTE — Discharge Instructions (Signed)
Resume usual medications and high fiber diet. Can stop using nitroglycerin ointment after 2 weeks. No driving for 24 hours. Office visit in one year.   High Fiber Diet A high fiber diet changes your normal diet to include more whole grains, legumes, fruits, and vegetables. Changes in the diet involve replacing refined carbohydrates with unrefined foods. The calorie level of the diet is essentially unchanged. The Dietary Reference Intake (recommended amount) for adult males is 38 g per day. For adult females, it is 25 g per day. Pregnant and lactating women should consume 28 g of fiber per day. Fiber is the intact part of a plant that is not broken down during digestion. Functional fiber is fiber that has been isolated from the plant to provide a beneficial effect in the body. PURPOSE  Increase stool bulk.   Ease and regulate bowel movements.   Lower cholesterol.  INDICATIONS THAT YOU NEED MORE FIBER  Constipation and hemorrhoids.   Uncomplicated diverticulosis (intestine condition) and irritable bowel syndrome.   Weight management.   As a protective measure against hardening of the arteries (atherosclerosis), diabetes, and cancer.  NOTE OF CAUTION If you have a digestive or bowel problem, ask your caregiver for advice before adding high fiber foods to your diet. Some of the following medical problems are such that a high fiber diet should not be used without consulting your caregiver:  Acute diverticulitis (intestine infection).   Partial small bowel obstructions.   Complicated diverticular disease involving bleeding, rupture (perforation), or abscess (boil, furuncle).   Presence of autonomic neuropathy (nerve damage) or gastric paresis (stomach cannot empty itself).  GUIDELINES FOR INCREASING FIBER  Start adding fiber to the diet slowly. A gradual increase of about 5 more grams (2 slices of whole-wheat bread, 2 servings of most fruits or vegetables, or 1 bowl of high fiber  cereal) per day is best. Too rapid an increase in fiber may result in constipation, flatulence, and bloating.   Drink enough water and fluids to keep your urine clear or pale yellow. Water, juice, or caffeine-free drinks are recommended. Not drinking enough fluid may cause constipation.   Eat a variety of high fiber foods rather than one type of fiber.   Try to increase your intake of fiber through using high fiber foods rather than fiber pills or supplements that contain small amounts of fiber.   The goal is to change the types of food eaten. Do not supplement your present diet with high fiber foods, but replace foods in your present diet.  INCLUDE A VARIETY OF FIBER SOURCES  Replace refined and processed grains with whole grains, canned fruits with fresh fruits, and incorporate other fiber sources. White rice, white breads, and most bakery goods contain little or no fiber.   Brown whole-grain rice, buckwheat oats, and many fruits and vegetables are all good sources of fiber. These include: broccoli, Brussels sprouts, cabbage, cauliflower, beets, sweet potatoes, white potatoes (skin on), carrots, tomatoes, eggplant, squash, berries, fresh fruits, and dried fruits.   Cereals appear to be the richest source of fiber. Cereal fiber is found in whole grains and bran. Bran is the fiber-rich outer coat of cereal grain, which is largely removed in refining. In whole-grain cereals, the bran remains. In breakfast cereals, the largest amount of fiber is found in those with "bran" in their names. The fiber content is sometimes indicated on the label.   You may need to include additional fruits and vegetables each day.   In baking, for  1 cup white flour, you may use the following substitutions:   1 cup whole-wheat flour minus 2 tbs.    cup white flour plus  cup whole-wheat flour.  Document Released: 01/06/2005 Document Revised: 12/26/2010 Document Reviewed: 11/14/2008 [facility] Patient Information  2012 [place], [place].

## 2011-07-07 ENCOUNTER — Other Ambulatory Visit (INDEPENDENT_AMBULATORY_CARE_PROVIDER_SITE_OTHER): Payer: Self-pay | Admitting: Internal Medicine

## 2011-07-07 ENCOUNTER — Telehealth (INDEPENDENT_AMBULATORY_CARE_PROVIDER_SITE_OTHER): Payer: Self-pay | Admitting: *Deleted

## 2011-07-07 NOTE — Telephone Encounter (Signed)
I talked with Melanie Mathews and explained to him the reason for doing the blood tests. I told him that I would be calling him or Melanie Mathews as soon as the results are available.

## 2011-07-07 NOTE — Telephone Encounter (Signed)
Consuelo's husband, Christen B[place], called to see if Dr. Karilyn Cota would please return his call. The phone number is [phone]. Christen Bame was told by [facility] the equipment was not cleaned good that was used to do the TCS. He is very concerned and has questions. Deshae is having the blood work done today.

## 2011-07-30 ENCOUNTER — Encounter (HOSPITAL_COMMUNITY): Payer: Self-pay | Admitting: Internal Medicine

## 2011-12-26 ENCOUNTER — Other Ambulatory Visit (INDEPENDENT_AMBULATORY_CARE_PROVIDER_SITE_OTHER): Payer: Self-pay | Admitting: Internal Medicine

## 2015-02-12 DIAGNOSIS — J0101 Acute recurrent maxillary sinusitis: Secondary | ICD-10-CM | POA: Diagnosis not present

## 2015-02-12 DIAGNOSIS — R05 Cough: Secondary | ICD-10-CM | POA: Diagnosis not present

## 2015-03-14 DIAGNOSIS — J029 Acute pharyngitis, unspecified: Secondary | ICD-10-CM | POA: Diagnosis not present

## 2015-04-04 DIAGNOSIS — R070 Pain in throat: Secondary | ICD-10-CM | POA: Diagnosis not present

## 2015-04-04 DIAGNOSIS — R1310 Dysphagia, unspecified: Secondary | ICD-10-CM | POA: Diagnosis not present

## 2015-04-04 DIAGNOSIS — R05 Cough: Secondary | ICD-10-CM | POA: Diagnosis not present

## 2015-04-04 DIAGNOSIS — R490 Dysphonia: Secondary | ICD-10-CM | POA: Diagnosis not present

## 2015-04-04 DIAGNOSIS — J387 Other diseases of larynx: Secondary | ICD-10-CM | POA: Diagnosis not present

## 2015-04-17 ENCOUNTER — Other Ambulatory Visit (INDEPENDENT_AMBULATORY_CARE_PROVIDER_SITE_OTHER): Payer: Self-pay | Admitting: Internal Medicine

## 2015-04-17 ENCOUNTER — Encounter (INDEPENDENT_AMBULATORY_CARE_PROVIDER_SITE_OTHER): Payer: Self-pay | Admitting: *Deleted

## 2015-04-17 ENCOUNTER — Encounter (INDEPENDENT_AMBULATORY_CARE_PROVIDER_SITE_OTHER): Payer: Self-pay | Admitting: Internal Medicine

## 2015-04-17 ENCOUNTER — Ambulatory Visit (INDEPENDENT_AMBULATORY_CARE_PROVIDER_SITE_OTHER): Payer: PPO | Admitting: Internal Medicine

## 2015-04-17 VITALS — BP 118/56 | HR 65 | Temp 98.3°F | Ht 67.5 in | Wt 228.3 lb

## 2015-04-17 DIAGNOSIS — R1319 Other dysphagia: Secondary | ICD-10-CM | POA: Diagnosis not present

## 2015-04-17 NOTE — Patient Instructions (Signed)
Stop the Zantac. Take Nexium 30 minutes before breakfast and 30 minutes before supper. The risks and benefits such as perforation, bleeding, and infection were reviewed with the patient and is agreeable.

## 2015-04-17 NOTE — Progress Notes (Signed)
Subjective:    Patient ID: Melanie Mathews, female    DOB: 1945-02-11, 70 y.o.   MRN: [id number]  HPI Referred by Dr. Dimas Aguas for dysphagia.  She tells me she had a sore throat for 8 weeks. Finally resolved last week. She saw Dr. Jearld Fenton about 2 weeks ago. She says he told her her throat was not red. He felt it was acid reflux. She tells me today she does not have a sore throat. No fever was associated with her symptoms.  She was told to take Nexium  am, and the Zantac (2) at night.    She tells me occasionally she does have some dysphagia. This is occuring about every 2 days. She chokes on liquids. Breads feel like they are slow to go down. She does have some epigastric tenderness. Her acid reflux is controlled with Nexium BID and Zantac. She is sleeping on two pillows.   03/18/2005 EGD/ED: Dysphagia No evidence of esohpagitis, ring or stricture noted, but esophagus dilated. Multiple polyps at gastric body some of which were biopsied for histology. Non erosive antral gastritis.      07/03/2011 Colonoscopy  Indications: Patient is 70 year old Caucasian female who presents with intermittent rectal pain. She is undergoing diagnostic colonoscopy.  Impression:  Examination performed to cecum. Mild sigmoid colon diverticulosis. External hemorrhoids. Small scar at dentate line possibly healed fissure.     Review of Systems Past Medical History  Diagnosis Date  . Diabetes mellitus   . Hernia   . Hypertension   . Anxiety   . Hypothyroidism     Past Surgical History  Procedure Laterality Date  . Colonoscopy    . Upper gastrointestinal endoscopy    . Cholecystectomy    . Cervical fusion    . Abdominal hysterectomy    . Knee surgery      Arthroscopic  . Colonoscopy  07/03/2011    Procedure: COLONOSCOPY;  Surgeon: Malissa Hippo, MD;  Location: [facility];  Service: Endoscopy;  Laterality: N/A;  1030    Allergies  Allergen Reactions  . Sulfur Itching and Swelling  .  Adhesive [Tape] Rash    Current Outpatient Prescriptions on File Prior to Visit  Medication Sig Dispense Refill  . furosemide (LASIX) 40 MG tablet Take 40 mg by mouth daily.    Marland Kitchen gabapentin (NEURONTIN) 800 MG tablet Take 800-1,600 mg by mouth 2 (two) times daily. Patient states that she takes 2 by mouth in the morning, and 2 by mouth at night    . levothyroxine (SYNTHROID, LEVOTHROID) 137 MCG tablet Take 125 mcg by mouth daily.     . Multiple Vitamins-Minerals (MULTIVITAMIN PO) Take 1 tablet by mouth daily.     Marland Kitchen NEXIUM 40 MG capsule TAKE ONE CAPSULE ONCE DAILY (Patient taking differently: twice a day) 30 capsule 11  . potassium chloride (K-DUR,KLOR-CON) 10 MEQ tablet Take 10 mEq by mouth daily.    . traMADol-acetaminophen (ULTRACET) 37.5-325 MG per tablet Take 2 tablets by mouth at bedtime.    Marland Kitchen telmisartan-hydrochlorothiazide (MICARDIS HCT) 80-25 MG per tablet Take 1 tablet by mouth daily. Reported on 04/17/2015     No current facility-administered medications on file prior to visit.        Objective:   Physical Exam Blood pressure 118/56, pulse 65, temperature 98.3 F (36.8 C), height 5' 7.5" (1.715 m), weight 228 lb 4.8 oz (103.556 kg).  Alert and oriented. Skin warm and dry. Oral mucosa is moist.   . Sclera anicteric, conjunctivae  is pink. Thyroid not enlarged. No cervical lymphadenopathy. Lungs clear. Heart regular rate and rhythm.  Abdomen is soft. Bowel sounds are positive. No hepatomegaly. No abdominal masses felt. No tenderness.  No edema to lower extremities.         Assessment & Plan:  Dysphagia. EGD/ED. The risks and benefits such as perforation, bleeding, and infection were reviewed with the patient and is agreeable. Nexium 40mg  BID.

## 2015-04-19 ENCOUNTER — Encounter (HOSPITAL_COMMUNITY): Payer: Self-pay | Admitting: *Deleted

## 2015-04-19 ENCOUNTER — Encounter (HOSPITAL_COMMUNITY)
Admission: RE | Disposition: A | Discharge status: Home / Self Care | Payer: Self-pay | Source: Ambulatory Visit | Attending: Internal Medicine

## 2015-04-19 ENCOUNTER — Ambulatory Visit (HOSPITAL_COMMUNITY)
Admission: RE | Admit: 2015-04-19 | Discharge: 2015-04-19 | Disposition: A | Discharge status: Home / Self Care | Payer: PPO | Source: Ambulatory Visit | Attending: Internal Medicine | Admitting: Internal Medicine

## 2015-04-19 DIAGNOSIS — Z87891 Personal history of nicotine dependence: Secondary | ICD-10-CM | POA: Insufficient documentation

## 2015-04-19 DIAGNOSIS — Z794 Long term (current) use of insulin: Secondary | ICD-10-CM | POA: Diagnosis not present

## 2015-04-19 DIAGNOSIS — I10 Essential (primary) hypertension: Secondary | ICD-10-CM | POA: Insufficient documentation

## 2015-04-19 DIAGNOSIS — R1319 Other dysphagia: Secondary | ICD-10-CM | POA: Insufficient documentation

## 2015-04-19 DIAGNOSIS — K210 Gastro-esophageal reflux disease with esophagitis: Secondary | ICD-10-CM

## 2015-04-19 DIAGNOSIS — E119 Type 2 diabetes mellitus without complications: Secondary | ICD-10-CM | POA: Diagnosis not present

## 2015-04-19 DIAGNOSIS — R1314 Dysphagia, pharyngoesophageal phase: Secondary | ICD-10-CM | POA: Diagnosis not present

## 2015-04-19 DIAGNOSIS — F419 Anxiety disorder, unspecified: Secondary | ICD-10-CM | POA: Insufficient documentation

## 2015-04-19 DIAGNOSIS — K449 Diaphragmatic hernia without obstruction or gangrene: Secondary | ICD-10-CM | POA: Diagnosis not present

## 2015-04-19 DIAGNOSIS — Z79899 Other long term (current) drug therapy: Secondary | ICD-10-CM | POA: Diagnosis not present

## 2015-04-19 DIAGNOSIS — E039 Hypothyroidism, unspecified: Secondary | ICD-10-CM | POA: Insufficient documentation

## 2015-04-19 DIAGNOSIS — K317 Polyp of stomach and duodenum: Secondary | ICD-10-CM | POA: Insufficient documentation

## 2015-04-19 DIAGNOSIS — Z808 Family history of malignant neoplasm of other organs or systems: Secondary | ICD-10-CM | POA: Insufficient documentation

## 2015-04-19 HISTORY — PX: ESOPHAGOGASTRODUODENOSCOPY: SHX5428

## 2015-04-19 HISTORY — PX: ESOPHAGEAL DILATION: SHX303

## 2015-04-19 LAB — GLUCOSE, CAPILLARY
GLUCOSE-CAPILLARY: 55 mg/dL — AB (ref 65–99)
Glucose-Capillary: 170 mg/dL — ABNORMAL HIGH (ref 65–99)

## 2015-04-19 SURGERY — EGD (ESOPHAGOGASTRODUODENOSCOPY)
Anesthesia: Moderate Sedation

## 2015-04-19 MED ORDER — BUTAMBEN-TETRACAINE-BENZOCAINE 2-2-14 % EX AERO
INHALATION_SPRAY | CUTANEOUS | Status: DC | PRN
  Administered 2015-04-19: 2 via TOPICAL

## 2015-04-19 MED ORDER — MIDAZOLAM HCL 5 MG/5ML IJ SOLN
INTRAMUSCULAR | Status: DC | PRN
  Administered 2015-04-19 (×2): 2 mg via INTRAVENOUS
  Administered 2015-04-19: 1 mg via INTRAVENOUS

## 2015-04-19 MED ORDER — GLUCAGON HCL (RDNA) 1 MG IJ SOLR
1.0000 mg | Freq: Once | INTRAMUSCULAR | Status: AC | PRN
  Filled 2015-04-19: qty 1

## 2015-04-19 MED ORDER — SODIUM CHLORIDE 0.9 % IV SOLN: INTRAVENOUS | Status: DC

## 2015-04-19 MED ORDER — GLUCAGON HCL RDNA (DIAGNOSTIC) 1 MG IJ SOLR
INTRAMUSCULAR | Status: AC
  Administered 2015-04-19: 1 mg
  Filled 2015-04-19: qty 1

## 2015-04-19 MED ORDER — DEXTROSE-NACL 5-0.9 % IV SOLN
INTRAVENOUS | Status: DC
  Administered 2015-04-19: 1000 mL via INTRAVENOUS

## 2015-04-19 MED ORDER — MIDAZOLAM HCL 5 MG/5ML IJ SOLN
INTRAMUSCULAR | Status: AC
  Filled 2015-04-19: qty 10

## 2015-04-19 MED ORDER — MEPERIDINE HCL 50 MG/ML IJ SOLN
INTRAMUSCULAR | Status: AC
  Filled 2015-04-19: qty 1

## 2015-04-19 MED ORDER — MEPERIDINE HCL 50 MG/ML IJ SOLN
INTRAMUSCULAR | Status: DC | PRN
  Administered 2015-04-19 (×2): 25 mg via INTRAVENOUS

## 2015-04-19 NOTE — Op Note (Signed)
[facility] Patient Name: Melanie Mathews Procedure Date: 04/19/2015 2:42 PM MRN: [id number] Date of Birth: [birth year] Attending MD: Lionel December , MD CSN: [id number] Age: 70 Admit Type: Outpatient Procedure:                Upper GI endoscopy Indications:              Esophageal dysphagia, Reflux esophagitis Providers:                Lionel December, MD, Cynda Acres, RN, Ina Homes,                            Technician Referring MD:             Selinda Flavin, MD (Referring MD) Medicines:                Cetacaine spray, Meperidine 50 mg IV, Midazolam 5                            mg IV Complications:            No immediate complications. Estimated Blood Loss:     Estimated blood loss: none. Procedure:                Pre-Anesthesia Assessment:                           - Prior to the procedure, a History and Physical                            was performed, and patient medications and                            allergies were reviewed. The patient's tolerance of                            previous anesthesia was also reviewed. The risks                            and benefits of the procedure and the sedation                            options and risks were discussed with the patient.                            All questions were answered, and informed consent                            was obtained. Prior Anticoagulants: The patient has                            taken no previous anticoagulant or antiplatelet                            agents. ASA Grade Assessment: II - A patient with  mild systemic disease. After reviewing the risks                            and benefits, the patient was deemed in                            satisfactory condition to undergo the procedure.                           After obtaining informed consent, the endoscope was                            passed under direct vision. Throughout the   procedure, the patient's blood pressure, pulse, and                            oxygen saturations were monitored continuously. The                            [id number]) was introduced through the                            mouth, and advanced to the second part of duodenum.                            The upper GI endoscopy was accomplished without                            difficulty. The patient tolerated the procedure                            well. Scope In: 2:51:21 PM Scope Out: 3:02:58 PM Total Procedure Duration: 0 hours 11 minutes 37 seconds  Findings:      The examined esophagus was normal.      Esophagogastric landmarks were identified: the gastroesophageal junction       was found at 38 cm from the incisors.      A 2 cm hiatal hernia was present.      Multiple 5 to 10 mm pedunculated and sessile polyps with no bleeding and       no stigmata of recent bleeding were found in the gastric body and fundus.      The duodenal bulb and second portion of the duodenum were normal.      No endoscopic abnormality was evident in the esophagus to explain the       patient's complaint of dysphagia. It was decided, however, to proceed       with dilation of the entire esophagus. The scope was withdrawn. Dilation       was performed with a Maloney dilator with no resistance at 54 Fr. The       dilation site was examined following endoscope reinsertion and showed no       change.      The hypopharynx was normal. Impression:               - Normal esophagus.                           -  Esophagogastric landmarks identified.                           - 2 cm hiatal hernia.                           - Multiple gastric polyps.                           - Normal duodenal bulb and second portion of the                            duodenum.                           - No endoscopic esophageal abnormality to explain                            patient's dysphagia. Esophagus dilated. Dilated.                            - Normal hypopharynx.                           - No specimens collected. Moderate Sedation:      Moderate (conscious) sedation was administered by the endoscopy nurse       and supervised by the endoscopist. The following parameters were       monitored: oxygen saturation, heart rate, blood pressure, CO2       capnography and response to care. Total physician intraservice time was       16 minutes. Recommendation:           - Patient has a contact number available for                            emergencies. The signs and symptoms of potential                            delayed complications were discussed with the                            patient. Return to normal activities tomorrow.                            Written discharge instructions were provided to the                            patient.                           - Resume previous diet today.                           - Continue present medications.                           - Telephone GI clinic in 1 week. if dysphagia  persists patient will need esophageal manometry and                            impedance study. Procedure Code(s):        --- Professional ---                           [phone], Esophagogastroduodenoscopy, flexible,                            transoral; diagnostic, including collection of                            specimen(s) by brushing or washing, when performed                            (separate procedure)                           43450, Dilation of esophagus, by unguided sound or                            bougie, single or multiple passes                           99152, Moderate sedation services provided by the                            same physician or other qualified health care                            professional performing the diagnostic or                            therapeutic service that the sedation supports,                             requiring the presence of an independent trained                            observer to assist in the monitoring of the                            patient's level of consciousness and physiological                            status; initial 15 minutes of intraservice time,                            patient age 50 years or older Diagnosis Code(s):        --- Professional ---                           K44.9, Diaphragmatic hernia without obstruction or  gangrene                           K31.7, Polyp of stomach and duodenum                           R13.14, Dysphagia, pharyngoesophageal phase                           K21.0, Gastro-esophageal reflux disease with                            esophagitis CPT copyright 2016 [employer]. All rights reserved. The codes documented in this report are preliminary and upon coder review may  be revised to meet current compliance requirements. Lionel December, MD Lionel December, MD 04/19/2015 3:25:34 PM This report has been signed electronically. Number of Addenda: 0

## 2015-04-19 NOTE — H&P (Signed)
Melanie Mathews is an 70 y.o. female.   Chief Complaint: Patient's here for EGD and ED. HPI: Patient is 70 year old Caucasian female was chronic GERD developed a sore throat which lasted for more than 2 months. She did not respond to antibiotics. She was seen by Dr. Jearld [facility]By[facility] and felt to have sore throat secondary to GERD. Nexium dose was doubled to 80 mg every morning and she is taking ranitidine at bedtime and she is finally getting better. She also complains of dysphagia primarily to solids but sometimes liquids. She points to her midsternal area site of bolus obstruction. She states she is watching her diet. She denies nausea vomiting melena or rectal bleeding. She has frequent diarrhea.  Past Medical History  Diagnosis Date  . Diabetes mellitus   . Hernia   . Hypertension   . Anxiety   . Hypothyroidism     Past Surgical History  Procedure Laterality Date  . Colonoscopy    . Upper gastrointestinal endoscopy    . Cholecystectomy    . Cervical fusion    . Abdominal hysterectomy    . Knee surgery      Arthroscopic  . Colonoscopy  07/03/2011    Procedure: COLONOSCOPY;  Surgeon: Malissa HippoNajeeb U Rehman, MD;  Location: [facility];  Service: Endoscopy;  Laterality: N/A;  1030    Family History  Problem Relation Age of Onset  . Arthritis Mother   . Pancreatic cancer Father   . Arthritis Sister   . COPD Sister   . Diabetes Sister    Social History:  reports that she quit smoking about 23 years ago. Her smoking use included Cigarettes. She has a 1.5 pack-year smoking history. She does not have any smokeless tobacco history on file. She reports that she does not drink alcohol or use illicit drugs.  Allergies:  Allergies  Allergen Reactions  . Sulfur Itching and Swelling  . Adhesive [Tape] Rash    Medications Prior to Admission  Medication Sig Dispense Refill  . furosemide (LASIX) 40 MG tablet Take 40 mg by mouth.    . gabapentin (NEURONTIN) 800 MG tablet Take 800-1,600 mg  by mouth 2 (two) times daily. Patient states that she takes 2 by mouth in the morning, and 2 by mouth at night    . insulin glargine (LANTUS) 100 UNIT/ML injection Inject 40 Units into the skin daily. 14 hs    . levothyroxine (SYNTHROID, LEVOTHROID) 137 MCG tablet Take 125 mcg by mouth daily.     Marland Kitchen. lisinopril-hydrochlorothiazide (PRINZIDE,ZESTORETIC) 20-12.5 MG tablet Take 1 tablet by mouth daily.    . Metformin HCl 500 MG/5ML SOLN Take 1,000 mg by mouth 2 (two) times daily.    . Multiple Vitamins-Minerals (MULTIVITAMIN PO) Take 1 tablet by mouth daily.     Marland Kitchen. NEXIUM 40 MG capsule TAKE ONE CAPSULE ONCE DAILY (Patient taking differently: twice a day) 30 capsule 11  . potassium chloride (K-DUR,KLOR-CON) 10 MEQ tablet Take 10 mEq by mouth daily.    . simvastatin (ZOCOR) 80 MG tablet Take 80 mg by mouth daily.    Marland Kitchen. telmisartan-hydrochlorothiazide (MICARDIS HCT) 80-25 MG per tablet Take 1 tablet by mouth daily. Reported on 04/17/2015    . traMADol-acetaminophen (ULTRACET) 37.5-325 MG per tablet Take 2 tablets by mouth at bedtime.    . furosemide (LASIX) 40 MG tablet Take 40 mg by mouth daily.      No results found for this or any previous visit (from the past 48 hour(s)).  No results found.  ROS  Blood pressure 96/66, pulse 70, temperature 98.8 F (37.1 C), temperature source Oral, resp. rate 19, SpO2 98 %. Physical Exam  Constitutional: She appears well-developed and well-nourished.  HENT:  Mouth/Throat: Oropharynx is clear and moist.  Eyes: Conjunctivae are normal. No scleral icterus.  Neck: No thyromegaly present.  Cardiovascular: Normal rate, regular rhythm and normal heart sounds.   No murmur heard. Respiratory: Effort normal and breath sounds normal.  GI: Soft. She exhibits no distension and no mass. There is no tenderness.  Musculoskeletal: She exhibits no edema.  Lymphadenopathy:    She has no cervical adenopathy.  Neurological: She is alert.  Skin: Skin is warm and dry.      Assessment/Plan Dysphagia to solids as well as liquids in a patient with chronic GERD. EGD with ED.  Malissa Hippo, MD 04/19/2015, 2:39 PM

## 2015-04-19 NOTE — Discharge Instructions (Signed)
Resume usual medications and diet. °No driving for 24 hours. °Please call office with progress report next week. ° °Gastrointestinal Endoscopy, Care After °Refer to this sheet in the next few weeks. These instructions provide you with information on caring for yourself after your procedure. Your caregiver may also give you more specific instructions. Your treatment has been planned according to current medical practices, but problems sometimes occur. Call your caregiver if you have any problems or questions after your procedure. °HOME CARE INSTRUCTIONS °· If you were given medicine to help you relax (sedative), do not drive, operate machinery, or sign important documents for 24 hours. °· Avoid alcohol and hot or warm beverages for the first 24 hours after the procedure. °· Only take over-the-counter or prescription medicines for pain, discomfort, or fever as directed by your caregiver. You may resume taking your normal medicines unless your caregiver tells you otherwise. Ask your caregiver when you may resume taking medicines that may cause bleeding, such as aspirin, clopidogrel, or warfarin. °· You may return to your normal diet and activities on the day after your procedure, or as directed by your caregiver. Walking may help to reduce any bloated feeling in your abdomen. °· Drink enough fluids to keep your urine clear or pale yellow. °· You may gargle with salt water if you have a sore throat. °SEEK IMMEDIATE MEDICAL CARE IF: °· You have severe nausea or vomiting. °· You have severe abdominal pain, abdominal cramps that last longer than 6 hours, or abdominal swelling (distention). °· You have severe shoulder or back pain. °· You have trouble swallowing. °· You have shortness of breath, your breathing is shallow, or you are breathing faster than normal. °· You have a fever or a rapid heartbeat. °· You vomit blood or material that looks like coffee grounds. °· You have bloody, black, or tarry stools. °MAKE SURE  YOU: °· Understand these instructions. °· Will watch your condition. °· Will get help right away if you are not doing well or get worse. °  °This information is not intended to replace advice given to you by your health care provider. Make sure you discuss any questions you have with your health care provider. °  °Document Released: 08/21/2003 Document Revised: 01/27/2014 Document Reviewed: 04/08/2011 °Elsevier Interactive Patient Education ©2016 [employer] ° °

## 2015-04-23 ENCOUNTER — Encounter (HOSPITAL_COMMUNITY): Payer: Self-pay | Admitting: Internal Medicine

## 2015-04-25 DIAGNOSIS — F329 Major depressive disorder, single episode, unspecified: Secondary | ICD-10-CM | POA: Diagnosis not present

## 2015-04-25 DIAGNOSIS — M545 Low back pain: Secondary | ICD-10-CM | POA: Diagnosis not present

## 2015-04-25 DIAGNOSIS — M25562 Pain in left knee: Secondary | ICD-10-CM | POA: Diagnosis not present

## 2015-04-25 DIAGNOSIS — E1140 Type 2 diabetes mellitus with diabetic neuropathy, unspecified: Secondary | ICD-10-CM | POA: Diagnosis not present

## 2015-04-25 DIAGNOSIS — M47812 Spondylosis without myelopathy or radiculopathy, cervical region: Secondary | ICD-10-CM | POA: Diagnosis not present

## 2015-04-25 DIAGNOSIS — M47816 Spondylosis without myelopathy or radiculopathy, lumbar region: Secondary | ICD-10-CM | POA: Diagnosis not present

## 2015-04-25 DIAGNOSIS — M25561 Pain in right knee: Secondary | ICD-10-CM | POA: Diagnosis not present

## 2015-04-25 DIAGNOSIS — M5030 Other cervical disc degeneration, unspecified cervical region: Secondary | ICD-10-CM | POA: Diagnosis not present

## 2015-05-11 DIAGNOSIS — Z1231 Encounter for screening mammogram for malignant neoplasm of breast: Secondary | ICD-10-CM | POA: Diagnosis not present

## 2015-06-02 DIAGNOSIS — E119 Type 2 diabetes mellitus without complications: Secondary | ICD-10-CM | POA: Diagnosis not present

## 2015-06-02 DIAGNOSIS — H40033 Anatomical narrow angle, bilateral: Secondary | ICD-10-CM | POA: Diagnosis not present

## 2015-06-11 DIAGNOSIS — H25811 Combined forms of age-related cataract, right eye: Secondary | ICD-10-CM | POA: Diagnosis not present

## 2015-06-12 DIAGNOSIS — M109 Gout, unspecified: Secondary | ICD-10-CM | POA: Diagnosis not present

## 2015-06-21 DIAGNOSIS — Z961 Presence of intraocular lens: Secondary | ICD-10-CM | POA: Diagnosis not present

## 2015-06-21 DIAGNOSIS — H25811 Combined forms of age-related cataract, right eye: Secondary | ICD-10-CM | POA: Diagnosis not present

## 2015-06-21 DIAGNOSIS — H2511 Age-related nuclear cataract, right eye: Secondary | ICD-10-CM | POA: Diagnosis not present

## 2015-07-13 DIAGNOSIS — E039 Hypothyroidism, unspecified: Secondary | ICD-10-CM | POA: Diagnosis not present

## 2015-07-13 DIAGNOSIS — I10 Essential (primary) hypertension: Secondary | ICD-10-CM | POA: Diagnosis not present

## 2015-07-13 DIAGNOSIS — K219 Gastro-esophageal reflux disease without esophagitis: Secondary | ICD-10-CM | POA: Diagnosis not present

## 2015-07-13 DIAGNOSIS — E7800 Pure hypercholesterolemia, unspecified: Secondary | ICD-10-CM | POA: Diagnosis not present

## 2015-07-13 DIAGNOSIS — E1165 Type 2 diabetes mellitus with hyperglycemia: Secondary | ICD-10-CM | POA: Diagnosis not present

## 2015-07-16 DIAGNOSIS — I10 Essential (primary) hypertension: Secondary | ICD-10-CM | POA: Diagnosis not present

## 2015-07-16 DIAGNOSIS — K219 Gastro-esophageal reflux disease without esophagitis: Secondary | ICD-10-CM | POA: Diagnosis not present

## 2015-07-16 DIAGNOSIS — M109 Gout, unspecified: Secondary | ICD-10-CM | POA: Diagnosis not present

## 2015-07-16 DIAGNOSIS — E039 Hypothyroidism, unspecified: Secondary | ICD-10-CM | POA: Diagnosis not present

## 2015-07-16 DIAGNOSIS — Z13220 Encounter for screening for lipoid disorders: Secondary | ICD-10-CM | POA: Diagnosis not present

## 2015-07-16 DIAGNOSIS — E1140 Type 2 diabetes mellitus with diabetic neuropathy, unspecified: Secondary | ICD-10-CM | POA: Diagnosis not present

## 2015-07-16 DIAGNOSIS — E1165 Type 2 diabetes mellitus with hyperglycemia: Secondary | ICD-10-CM | POA: Diagnosis not present

## 2015-07-26 DIAGNOSIS — H25812 Combined forms of age-related cataract, left eye: Secondary | ICD-10-CM | POA: Diagnosis not present

## 2015-07-26 DIAGNOSIS — H2512 Age-related nuclear cataract, left eye: Secondary | ICD-10-CM | POA: Diagnosis not present

## 2015-09-10 ENCOUNTER — Encounter (INDEPENDENT_AMBULATORY_CARE_PROVIDER_SITE_OTHER): Payer: PPO | Admitting: Ophthalmology

## 2015-09-10 DIAGNOSIS — I10 Essential (primary) hypertension: Secondary | ICD-10-CM

## 2015-09-10 DIAGNOSIS — H59032 Cystoid macular edema following cataract surgery, left eye: Secondary | ICD-10-CM | POA: Diagnosis not present

## 2015-09-10 DIAGNOSIS — H35033 Hypertensive retinopathy, bilateral: Secondary | ICD-10-CM

## 2015-09-10 DIAGNOSIS — H43813 Vitreous degeneration, bilateral: Secondary | ICD-10-CM | POA: Diagnosis not present

## 2015-09-25 DIAGNOSIS — B351 Tinea unguium: Secondary | ICD-10-CM | POA: Diagnosis not present

## 2015-09-25 DIAGNOSIS — E1142 Type 2 diabetes mellitus with diabetic polyneuropathy: Secondary | ICD-10-CM | POA: Diagnosis not present

## 2015-10-15 DIAGNOSIS — L247 Irritant contact dermatitis due to plants, except food: Secondary | ICD-10-CM | POA: Diagnosis not present

## 2015-10-15 DIAGNOSIS — Z6836 Body mass index (BMI) 36.0-36.9, adult: Secondary | ICD-10-CM | POA: Diagnosis not present

## 2015-10-18 DIAGNOSIS — L247 Irritant contact dermatitis due to plants, except food: Secondary | ICD-10-CM | POA: Diagnosis not present

## 2015-10-18 DIAGNOSIS — Z6835 Body mass index (BMI) 35.0-35.9, adult: Secondary | ICD-10-CM | POA: Diagnosis not present

## 2015-10-22 ENCOUNTER — Encounter (INDEPENDENT_AMBULATORY_CARE_PROVIDER_SITE_OTHER): Payer: PPO | Admitting: Ophthalmology

## 2015-10-22 DIAGNOSIS — H348312 Tributary (branch) retinal vein occlusion, right eye, stable: Secondary | ICD-10-CM

## 2015-10-22 DIAGNOSIS — H35033 Hypertensive retinopathy, bilateral: Secondary | ICD-10-CM | POA: Diagnosis not present

## 2015-10-22 DIAGNOSIS — H59032 Cystoid macular edema following cataract surgery, left eye: Secondary | ICD-10-CM | POA: Diagnosis not present

## 2015-10-22 DIAGNOSIS — I10 Essential (primary) hypertension: Secondary | ICD-10-CM

## 2015-10-22 DIAGNOSIS — H43813 Vitreous degeneration, bilateral: Secondary | ICD-10-CM | POA: Diagnosis not present

## 2015-10-25 DIAGNOSIS — L247 Irritant contact dermatitis due to plants, except food: Secondary | ICD-10-CM | POA: Diagnosis not present

## 2015-10-25 DIAGNOSIS — B86 Scabies: Secondary | ICD-10-CM | POA: Diagnosis not present

## 2015-10-25 DIAGNOSIS — Z6836 Body mass index (BMI) 36.0-36.9, adult: Secondary | ICD-10-CM | POA: Diagnosis not present

## 2015-11-01 DIAGNOSIS — F411 Generalized anxiety disorder: Secondary | ICD-10-CM | POA: Diagnosis not present

## 2015-11-01 DIAGNOSIS — Z6836 Body mass index (BMI) 36.0-36.9, adult: Secondary | ICD-10-CM | POA: Diagnosis not present

## 2015-11-01 DIAGNOSIS — L247 Irritant contact dermatitis due to plants, except food: Secondary | ICD-10-CM | POA: Diagnosis not present

## 2015-12-18 DIAGNOSIS — B351 Tinea unguium: Secondary | ICD-10-CM | POA: Diagnosis not present

## 2015-12-18 DIAGNOSIS — L84 Corns and callosities: Secondary | ICD-10-CM | POA: Diagnosis not present

## 2015-12-18 DIAGNOSIS — E1142 Type 2 diabetes mellitus with diabetic polyneuropathy: Secondary | ICD-10-CM | POA: Diagnosis not present

## 2015-12-31 DIAGNOSIS — M5137 Other intervertebral disc degeneration, lumbosacral region: Secondary | ICD-10-CM | POA: Diagnosis not present

## 2015-12-31 DIAGNOSIS — M9905 Segmental and somatic dysfunction of pelvic region: Secondary | ICD-10-CM | POA: Diagnosis not present

## 2016-01-01 DIAGNOSIS — M5137 Other intervertebral disc degeneration, lumbosacral region: Secondary | ICD-10-CM | POA: Diagnosis not present

## 2016-01-01 DIAGNOSIS — M9905 Segmental and somatic dysfunction of pelvic region: Secondary | ICD-10-CM | POA: Diagnosis not present

## 2016-01-02 DIAGNOSIS — M9905 Segmental and somatic dysfunction of pelvic region: Secondary | ICD-10-CM | POA: Diagnosis not present

## 2016-01-02 DIAGNOSIS — M5137 Other intervertebral disc degeneration, lumbosacral region: Secondary | ICD-10-CM | POA: Diagnosis not present

## 2016-01-07 DIAGNOSIS — M9905 Segmental and somatic dysfunction of pelvic region: Secondary | ICD-10-CM | POA: Diagnosis not present

## 2016-01-07 DIAGNOSIS — M5137 Other intervertebral disc degeneration, lumbosacral region: Secondary | ICD-10-CM | POA: Diagnosis not present

## 2016-01-08 DIAGNOSIS — M5137 Other intervertebral disc degeneration, lumbosacral region: Secondary | ICD-10-CM | POA: Diagnosis not present

## 2016-01-08 DIAGNOSIS — M9905 Segmental and somatic dysfunction of pelvic region: Secondary | ICD-10-CM | POA: Diagnosis not present

## 2016-01-09 DIAGNOSIS — M9905 Segmental and somatic dysfunction of pelvic region: Secondary | ICD-10-CM | POA: Diagnosis not present

## 2016-01-09 DIAGNOSIS — M5137 Other intervertebral disc degeneration, lumbosacral region: Secondary | ICD-10-CM | POA: Diagnosis not present

## 2016-01-10 DIAGNOSIS — E1140 Type 2 diabetes mellitus with diabetic neuropathy, unspecified: Secondary | ICD-10-CM | POA: Diagnosis not present

## 2016-01-10 DIAGNOSIS — G6289 Other specified polyneuropathies: Secondary | ICD-10-CM | POA: Diagnosis not present

## 2016-01-10 DIAGNOSIS — E1165 Type 2 diabetes mellitus with hyperglycemia: Secondary | ICD-10-CM | POA: Diagnosis not present

## 2016-01-10 DIAGNOSIS — E7800 Pure hypercholesterolemia, unspecified: Secondary | ICD-10-CM | POA: Diagnosis not present

## 2016-01-10 DIAGNOSIS — K219 Gastro-esophageal reflux disease without esophagitis: Secondary | ICD-10-CM | POA: Diagnosis not present

## 2016-01-10 DIAGNOSIS — M109 Gout, unspecified: Secondary | ICD-10-CM | POA: Diagnosis not present

## 2016-01-10 DIAGNOSIS — F411 Generalized anxiety disorder: Secondary | ICD-10-CM | POA: Diagnosis not present

## 2016-01-10 DIAGNOSIS — E039 Hypothyroidism, unspecified: Secondary | ICD-10-CM | POA: Diagnosis not present

## 2016-01-16 DIAGNOSIS — M9905 Segmental and somatic dysfunction of pelvic region: Secondary | ICD-10-CM | POA: Diagnosis not present

## 2016-01-16 DIAGNOSIS — Z6836 Body mass index (BMI) 36.0-36.9, adult: Secondary | ICD-10-CM | POA: Diagnosis not present

## 2016-01-16 DIAGNOSIS — E039 Hypothyroidism, unspecified: Secondary | ICD-10-CM | POA: Diagnosis not present

## 2016-01-16 DIAGNOSIS — I10 Essential (primary) hypertension: Secondary | ICD-10-CM | POA: Diagnosis not present

## 2016-01-16 DIAGNOSIS — Z0001 Encounter for general adult medical examination with abnormal findings: Secondary | ICD-10-CM | POA: Diagnosis not present

## 2016-01-16 DIAGNOSIS — R5381 Other malaise: Secondary | ICD-10-CM | POA: Diagnosis not present

## 2016-01-16 DIAGNOSIS — F411 Generalized anxiety disorder: Secondary | ICD-10-CM | POA: Diagnosis not present

## 2016-01-16 DIAGNOSIS — M5137 Other intervertebral disc degeneration, lumbosacral region: Secondary | ICD-10-CM | POA: Diagnosis not present

## 2016-01-16 DIAGNOSIS — E1140 Type 2 diabetes mellitus with diabetic neuropathy, unspecified: Secondary | ICD-10-CM | POA: Diagnosis not present

## 2016-01-16 DIAGNOSIS — M109 Gout, unspecified: Secondary | ICD-10-CM | POA: Diagnosis not present

## 2016-01-17 DIAGNOSIS — M5137 Other intervertebral disc degeneration, lumbosacral region: Secondary | ICD-10-CM | POA: Diagnosis not present

## 2016-01-17 DIAGNOSIS — M9905 Segmental and somatic dysfunction of pelvic region: Secondary | ICD-10-CM | POA: Diagnosis not present

## 2016-01-18 DIAGNOSIS — M9905 Segmental and somatic dysfunction of pelvic region: Secondary | ICD-10-CM | POA: Diagnosis not present

## 2016-01-18 DIAGNOSIS — M5137 Other intervertebral disc degeneration, lumbosacral region: Secondary | ICD-10-CM | POA: Diagnosis not present

## 2016-01-22 DIAGNOSIS — M5137 Other intervertebral disc degeneration, lumbosacral region: Secondary | ICD-10-CM | POA: Diagnosis not present

## 2016-01-22 DIAGNOSIS — M9905 Segmental and somatic dysfunction of pelvic region: Secondary | ICD-10-CM | POA: Diagnosis not present

## 2016-01-23 DIAGNOSIS — M9905 Segmental and somatic dysfunction of pelvic region: Secondary | ICD-10-CM | POA: Diagnosis not present

## 2016-01-23 DIAGNOSIS — M5137 Other intervertebral disc degeneration, lumbosacral region: Secondary | ICD-10-CM | POA: Diagnosis not present

## 2016-01-29 DIAGNOSIS — R51 Headache: Secondary | ICD-10-CM | POA: Diagnosis not present

## 2016-01-29 DIAGNOSIS — R531 Weakness: Secondary | ICD-10-CM | POA: Diagnosis not present

## 2016-01-29 DIAGNOSIS — R430 Anosmia: Secondary | ICD-10-CM | POA: Diagnosis not present

## 2016-01-29 DIAGNOSIS — R930 Abnormal findings on diagnostic imaging of skull and head, not elsewhere classified: Secondary | ICD-10-CM | POA: Diagnosis not present

## 2016-01-29 DIAGNOSIS — I999 Unspecified disorder of circulatory system: Secondary | ICD-10-CM | POA: Diagnosis not present

## 2016-01-29 DIAGNOSIS — G319 Degenerative disease of nervous system, unspecified: Secondary | ICD-10-CM | POA: Diagnosis not present

## 2016-03-11 DIAGNOSIS — E1142 Type 2 diabetes mellitus with diabetic polyneuropathy: Secondary | ICD-10-CM | POA: Diagnosis not present

## 2016-03-11 DIAGNOSIS — B351 Tinea unguium: Secondary | ICD-10-CM | POA: Diagnosis not present

## 2016-03-11 DIAGNOSIS — L84 Corns and callosities: Secondary | ICD-10-CM | POA: Diagnosis not present

## 2016-03-27 DIAGNOSIS — H578 Other specified disorders of eye and adnexa: Secondary | ICD-10-CM | POA: Diagnosis not present

## 2016-04-03 DIAGNOSIS — H578 Other specified disorders of eye and adnexa: Secondary | ICD-10-CM | POA: Diagnosis not present

## 2016-04-15 DIAGNOSIS — H578 Other specified disorders of eye and adnexa: Secondary | ICD-10-CM | POA: Diagnosis not present

## 2016-04-20 DIAGNOSIS — H578 Other specified disorders of eye and adnexa: Secondary | ICD-10-CM | POA: Diagnosis not present

## 2016-04-24 DIAGNOSIS — H578 Other specified disorders of eye and adnexa: Secondary | ICD-10-CM | POA: Diagnosis not present

## 2016-05-02 DIAGNOSIS — Z6839 Body mass index (BMI) 39.0-39.9, adult: Secondary | ICD-10-CM | POA: Diagnosis not present

## 2016-05-02 DIAGNOSIS — J189 Pneumonia, unspecified organism: Secondary | ICD-10-CM | POA: Diagnosis not present

## 2016-06-17 DIAGNOSIS — B351 Tinea unguium: Secondary | ICD-10-CM | POA: Diagnosis not present

## 2016-06-17 DIAGNOSIS — E1142 Type 2 diabetes mellitus with diabetic polyneuropathy: Secondary | ICD-10-CM | POA: Diagnosis not present

## 2016-06-17 DIAGNOSIS — M79676 Pain in unspecified toe(s): Secondary | ICD-10-CM | POA: Diagnosis not present

## 2016-06-17 DIAGNOSIS — L84 Corns and callosities: Secondary | ICD-10-CM | POA: Diagnosis not present

## 2016-07-10 DIAGNOSIS — Z6836 Body mass index (BMI) 36.0-36.9, adult: Secondary | ICD-10-CM | POA: Diagnosis not present

## 2016-07-10 DIAGNOSIS — F411 Generalized anxiety disorder: Secondary | ICD-10-CM | POA: Diagnosis not present

## 2016-07-10 DIAGNOSIS — E039 Hypothyroidism, unspecified: Secondary | ICD-10-CM | POA: Diagnosis not present

## 2016-07-10 DIAGNOSIS — E1165 Type 2 diabetes mellitus with hyperglycemia: Secondary | ICD-10-CM | POA: Diagnosis not present

## 2016-10-28 DIAGNOSIS — Z23 Encounter for immunization: Secondary | ICD-10-CM | POA: Diagnosis not present

## 2017-02-06 DIAGNOSIS — Z0001 Encounter for general adult medical examination with abnormal findings: Secondary | ICD-10-CM | POA: Diagnosis not present

## 2017-02-06 DIAGNOSIS — E1140 Type 2 diabetes mellitus with diabetic neuropathy, unspecified: Secondary | ICD-10-CM | POA: Diagnosis not present

## 2017-02-06 DIAGNOSIS — M109 Gout, unspecified: Secondary | ICD-10-CM | POA: Diagnosis not present

## 2017-02-06 DIAGNOSIS — E7800 Pure hypercholesterolemia, unspecified: Secondary | ICD-10-CM | POA: Diagnosis not present

## 2017-02-06 DIAGNOSIS — E1165 Type 2 diabetes mellitus with hyperglycemia: Secondary | ICD-10-CM | POA: Diagnosis not present

## 2017-02-06 DIAGNOSIS — K219 Gastro-esophageal reflux disease without esophagitis: Secondary | ICD-10-CM | POA: Diagnosis not present

## 2017-02-06 DIAGNOSIS — I10 Essential (primary) hypertension: Secondary | ICD-10-CM | POA: Diagnosis not present

## 2017-02-06 DIAGNOSIS — E039 Hypothyroidism, unspecified: Secondary | ICD-10-CM | POA: Diagnosis not present

## 2017-02-06 DIAGNOSIS — Z6835 Body mass index (BMI) 35.0-35.9, adult: Secondary | ICD-10-CM | POA: Diagnosis not present

## 2017-02-11 DIAGNOSIS — Z0001 Encounter for general adult medical examination with abnormal findings: Secondary | ICD-10-CM | POA: Diagnosis not present

## 2017-02-11 DIAGNOSIS — I10 Essential (primary) hypertension: Secondary | ICD-10-CM | POA: Diagnosis not present

## 2017-02-11 DIAGNOSIS — E039 Hypothyroidism, unspecified: Secondary | ICD-10-CM | POA: Diagnosis not present

## 2017-02-11 DIAGNOSIS — F411 Generalized anxiety disorder: Secondary | ICD-10-CM | POA: Diagnosis not present

## 2017-02-11 DIAGNOSIS — Z6834 Body mass index (BMI) 34.0-34.9, adult: Secondary | ICD-10-CM | POA: Diagnosis not present

## 2017-02-11 DIAGNOSIS — E1165 Type 2 diabetes mellitus with hyperglycemia: Secondary | ICD-10-CM | POA: Diagnosis not present

## 2017-04-21 DIAGNOSIS — E11311 Type 2 diabetes mellitus with unspecified diabetic retinopathy with macular edema: Secondary | ICD-10-CM | POA: Diagnosis not present

## 2017-04-21 DIAGNOSIS — H524 Presbyopia: Secondary | ICD-10-CM | POA: Diagnosis not present

## 2017-08-11 DIAGNOSIS — E7800 Pure hypercholesterolemia, unspecified: Secondary | ICD-10-CM | POA: Diagnosis not present

## 2017-08-11 DIAGNOSIS — E1165 Type 2 diabetes mellitus with hyperglycemia: Secondary | ICD-10-CM | POA: Diagnosis not present

## 2017-08-11 DIAGNOSIS — E1140 Type 2 diabetes mellitus with diabetic neuropathy, unspecified: Secondary | ICD-10-CM | POA: Diagnosis not present

## 2017-08-11 DIAGNOSIS — I10 Essential (primary) hypertension: Secondary | ICD-10-CM | POA: Diagnosis not present

## 2017-08-11 DIAGNOSIS — E039 Hypothyroidism, unspecified: Secondary | ICD-10-CM | POA: Diagnosis not present

## 2017-08-11 DIAGNOSIS — K219 Gastro-esophageal reflux disease without esophagitis: Secondary | ICD-10-CM | POA: Diagnosis not present

## 2017-08-18 DIAGNOSIS — Z1389 Encounter for screening for other disorder: Secondary | ICD-10-CM | POA: Diagnosis not present

## 2017-08-18 DIAGNOSIS — Z6837 Body mass index (BMI) 37.0-37.9, adult: Secondary | ICD-10-CM | POA: Diagnosis not present

## 2017-08-18 DIAGNOSIS — M545 Low back pain: Secondary | ICD-10-CM | POA: Diagnosis not present

## 2017-08-18 DIAGNOSIS — E1140 Type 2 diabetes mellitus with diabetic neuropathy, unspecified: Secondary | ICD-10-CM | POA: Diagnosis not present

## 2017-08-18 DIAGNOSIS — Z1331 Encounter for screening for depression: Secondary | ICD-10-CM | POA: Diagnosis not present

## 2017-08-18 DIAGNOSIS — E1165 Type 2 diabetes mellitus with hyperglycemia: Secondary | ICD-10-CM | POA: Diagnosis not present

## 2017-08-18 DIAGNOSIS — I10 Essential (primary) hypertension: Secondary | ICD-10-CM | POA: Diagnosis not present

## 2017-08-18 DIAGNOSIS — E039 Hypothyroidism, unspecified: Secondary | ICD-10-CM | POA: Diagnosis not present

## 2017-08-27 DIAGNOSIS — Z6837 Body mass index (BMI) 37.0-37.9, adult: Secondary | ICD-10-CM | POA: Diagnosis not present

## 2017-08-27 DIAGNOSIS — N952 Postmenopausal atrophic vaginitis: Secondary | ICD-10-CM | POA: Diagnosis not present

## 2017-09-24 DIAGNOSIS — M79672 Pain in left foot: Secondary | ICD-10-CM | POA: Diagnosis not present

## 2017-09-24 DIAGNOSIS — R5383 Other fatigue: Secondary | ICD-10-CM | POA: Diagnosis not present

## 2017-09-24 DIAGNOSIS — R1084 Generalized abdominal pain: Secondary | ICD-10-CM | POA: Diagnosis not present

## 2017-09-24 DIAGNOSIS — Z6834 Body mass index (BMI) 34.0-34.9, adult: Secondary | ICD-10-CM | POA: Diagnosis not present

## 2017-10-02 DIAGNOSIS — R935 Abnormal findings on diagnostic imaging of other abdominal regions, including retroperitoneum: Secondary | ICD-10-CM | POA: Diagnosis not present

## 2017-10-02 DIAGNOSIS — R1084 Generalized abdominal pain: Secondary | ICD-10-CM | POA: Diagnosis not present

## 2017-10-02 DIAGNOSIS — Z9049 Acquired absence of other specified parts of digestive tract: Secondary | ICD-10-CM | POA: Diagnosis not present

## 2017-10-02 DIAGNOSIS — K828 Other specified diseases of gallbladder: Secondary | ICD-10-CM | POA: Diagnosis not present

## 2017-10-24 DIAGNOSIS — Z23 Encounter for immunization: Secondary | ICD-10-CM | POA: Diagnosis not present

## 2017-12-19 DIAGNOSIS — E1140 Type 2 diabetes mellitus with diabetic neuropathy, unspecified: Secondary | ICD-10-CM | POA: Diagnosis not present

## 2017-12-19 DIAGNOSIS — E7800 Pure hypercholesterolemia, unspecified: Secondary | ICD-10-CM | POA: Diagnosis not present

## 2017-12-19 DIAGNOSIS — I10 Essential (primary) hypertension: Secondary | ICD-10-CM | POA: Diagnosis not present

## 2017-12-19 DIAGNOSIS — E039 Hypothyroidism, unspecified: Secondary | ICD-10-CM | POA: Diagnosis not present

## 2017-12-23 DIAGNOSIS — M109 Gout, unspecified: Secondary | ICD-10-CM | POA: Diagnosis not present

## 2017-12-23 DIAGNOSIS — Z6837 Body mass index (BMI) 37.0-37.9, adult: Secondary | ICD-10-CM | POA: Diagnosis not present

## 2017-12-23 DIAGNOSIS — M7989 Other specified soft tissue disorders: Secondary | ICD-10-CM | POA: Diagnosis not present

## 2018-01-18 DIAGNOSIS — M109 Gout, unspecified: Secondary | ICD-10-CM | POA: Diagnosis not present

## 2018-01-18 DIAGNOSIS — F419 Anxiety disorder, unspecified: Secondary | ICD-10-CM | POA: Diagnosis not present

## 2018-01-27 ENCOUNTER — Ambulatory Visit (INDEPENDENT_AMBULATORY_CARE_PROVIDER_SITE_OTHER): Payer: PPO | Admitting: Internal Medicine

## 2018-01-28 ENCOUNTER — Ambulatory Visit (INDEPENDENT_AMBULATORY_CARE_PROVIDER_SITE_OTHER): Payer: PPO | Admitting: Internal Medicine

## 2018-01-28 ENCOUNTER — Encounter (INDEPENDENT_AMBULATORY_CARE_PROVIDER_SITE_OTHER): Payer: Self-pay | Admitting: *Deleted

## 2018-01-28 ENCOUNTER — Encounter (INDEPENDENT_AMBULATORY_CARE_PROVIDER_SITE_OTHER): Payer: Self-pay | Admitting: Internal Medicine

## 2018-01-28 VITALS — BP 110/70 | HR 80 | Temp 98.1°F | Ht 67.5 in | Wt 227.4 lb

## 2018-01-28 DIAGNOSIS — R1013 Epigastric pain: Secondary | ICD-10-CM | POA: Diagnosis not present

## 2018-01-28 DIAGNOSIS — R1030 Lower abdominal pain, unspecified: Secondary | ICD-10-CM | POA: Diagnosis not present

## 2018-01-28 LAB — CREATININE, SERUM: Creat: 1.4 mg/dL — ABNORMAL HIGH (ref 0.60–0.93)

## 2018-01-28 NOTE — Patient Instructions (Signed)
Will get a CT abdomen/pelvis with CM.

## 2018-01-28 NOTE — Addendum Note (Signed)
Addended by: Len Blalock on: 01/28/2018 09:07 AM   Modules accepted: Orders

## 2018-01-28 NOTE — Progress Notes (Signed)
Subjective:    Patient ID: Melanie LimingPatsy W Mathews, female    DOB: [birth year], 73 y.o.   MRN: [id number]  HPI Presents today with c/o abdominal pain after eating.  She points to her epigastric region. Symptoms for about 2 weeks was very painful.  She ate chicken noodle soup and potato.  She also c/o flank pain. She says even if she twisted around she had pain.  She says she has hx of diverticulitis. She says she is better, but her abdomen is still not quite right. She denies any fever. No weight loss in the past 2 weeks. She says her abdomen sound loud.  Her BMs are okay. She is not constipated. No melena or BRRB. Stools are smaller than they use to be .  GERD for the most part controlled with Nexium.   03/27/2015 EGD: esophageal dysphagia, reflux esophagitis: Normal esophagus.                             - 2 cm hiatal hernia.                           - Multiple gastric polyps.                           - Normal duodenal bulb and second portion of the                            duodenum.                           - No endoscopic esophageal abnormality to explain                            patient's dysphagia. Esophagus dilated. Dilated.                           - Normal hypopharynx.  07/03/2011 Colonoscopy: intermittent rectal pain:  Examination performed to cecum. Mild sigmoid colon diverticulosis. External hemorrhoids. Small scar at dentate line possibly healed fissure.   Review of Systems Past Medical History:  Diagnosis Date  . Anxiety   . Diabetes mellitus   . Hernia   . Hypertension   . Hypothyroidism     Past Surgical History:  Procedure Laterality Date  . ABDOMINAL HYSTERECTOMY    . CERVICAL FUSION    . CHOLECYSTECTOMY    . COLONOSCOPY    . COLONOSCOPY  07/03/2011   Procedure: COLONOSCOPY;  Surgeon: Malissa HippoNajeeb U Rehman, MD;  Location: [facility];  Service: Endoscopy;  Laterality: N/A;  1030  . ESOPHAGEAL DILATION N/A 04/19/2015   Procedure: ESOPHAGEAL DILATION;   Surgeon: Malissa HippoNajeeb U Rehman, MD;  Location: [facility];  Service: Endoscopy;  Laterality: N/A;  . ESOPHAGOGASTRODUODENOSCOPY N/A 04/19/2015   Procedure: ESOPHAGOGASTRODUODENOSCOPY (EGD);  Surgeon: Malissa HippoNajeeb U Rehman, MD;  Location: [facility];  Service: Endoscopy;  Laterality: N/A;  2:05  . KNEE SURGERY     Arthroscopic  . UPPER GASTROINTESTINAL ENDOSCOPY      Allergies  Allergen Reactions  . Sulfur Itching and Swelling  . Adhesive [Tape] Rash    Current Outpatient Medications on File Prior to Visit  Medication Sig Dispense Refill  . furosemide (LASIX)  40 MG tablet Take 40 mg by mouth.    . gabapentin (NEURONTIN) 800 MG tablet Take 800-1,600 mg by mouth 2 (two) times daily. Patient states that she takes 2 by mouth in the morning, and 2 by mouth at night    . insulin glargine (LANTUS) 100 UNIT/ML injection Inject 40 Units into the skin daily. In am and 12 at hs    . lactobacillus acidophilus (BACID) TABS tablet Take 2 tablets by mouth 3 (three) times daily.    Marland Kitchen levothyroxine (SYNTHROID, LEVOTHROID) 137 MCG tablet Take 125 mcg by mouth daily.     Marland Kitchen lisinopril-hydrochlorothiazide (PRINZIDE,ZESTORETIC) 20-12.5 MG tablet Take 1 tablet by mouth daily.    . Metformin HCl 500 MG/5ML SOLN Take 1,000 mg by mouth 2 (two) times daily.    . Multiple Vitamins-Minerals (MULTIVITAMIN PO) Take 1 tablet by mouth daily.     Marland Kitchen NEXIUM 40 MG capsule TAKE ONE CAPSULE ONCE DAILY (Patient taking differently: Take 40 mg by mouth daily. ) 30 capsule 11  . simvastatin (ZOCOR) 80 MG tablet Take 80 mg by mouth daily.    Marland Kitchen telmisartan-hydrochlorothiazide (MICARDIS HCT) 80-25 MG per tablet Take 1 tablet by mouth daily. Reported on 04/17/2015    . traMADol-acetaminophen (ULTRACET) 37.5-325 MG per tablet Take 2 tablets by mouth at bedtime.    . TURMERIC PO Take 500 mg by mouth 2 (two) times daily.     No current facility-administered medications on file prior to visit.         Objective:   Physical Exam Blood  pressure 110/70, pulse 80, temperature 98.1 F (36.7 C), height 5' 7.5" (1.715 m), weight 227 lb 6.4 oz (103.1 kg).  Alert and oriented. Skin warm and dry. Oral mucosa is moist.   . Sclera anicteric, conjunctivae is pink. Thyroid not enlarged. No cervical lymphadenopathy. Lungs clear. Heart regular rate and rhythm.  Abdomen is soft. Bowel sounds are positive. No hepatomegaly. No abdominal masses felt. Slight tenderness epigastric region and left lower abdomen.  No edema to lower extremities.        Assessment & Plan:  Ep;igastaric pain which is much better. She will continue the Nexium.   Having some lower abdominal pain. Will get a CT scan to be sure she does not have diverticulitis. Hx of diverticulitis per patient in the past.

## 2018-02-10 ENCOUNTER — Ambulatory Visit (HOSPITAL_COMMUNITY)
Admission: RE | Admit: 2018-02-10 | Discharge: 2018-02-10 | Disposition: A | Discharge status: Home / Self Care | Payer: PPO | Source: Ambulatory Visit | Attending: Internal Medicine | Admitting: Internal Medicine

## 2018-02-10 DIAGNOSIS — R1030 Lower abdominal pain, unspecified: Secondary | ICD-10-CM | POA: Insufficient documentation

## 2018-02-10 DIAGNOSIS — K5730 Diverticulosis of large intestine without perforation or abscess without bleeding: Secondary | ICD-10-CM | POA: Diagnosis not present

## 2018-02-10 MED ORDER — IOHEXOL 300 MG/ML  SOLN
75.0000 mL | Freq: Once | INTRAMUSCULAR | Status: AC | PRN
  Administered 2018-02-10: 75 mL via INTRAVENOUS

## 2018-02-11 DIAGNOSIS — Z6836 Body mass index (BMI) 36.0-36.9, adult: Secondary | ICD-10-CM | POA: Diagnosis not present

## 2018-02-11 DIAGNOSIS — E1140 Type 2 diabetes mellitus with diabetic neuropathy, unspecified: Secondary | ICD-10-CM | POA: Diagnosis not present

## 2018-02-11 DIAGNOSIS — E1165 Type 2 diabetes mellitus with hyperglycemia: Secondary | ICD-10-CM | POA: Diagnosis not present

## 2018-02-11 DIAGNOSIS — E860 Dehydration: Secondary | ICD-10-CM | POA: Diagnosis not present

## 2018-02-11 DIAGNOSIS — R42 Dizziness and giddiness: Secondary | ICD-10-CM | POA: Diagnosis not present

## 2018-02-11 DIAGNOSIS — F411 Generalized anxiety disorder: Secondary | ICD-10-CM | POA: Diagnosis not present

## 2018-02-11 DIAGNOSIS — I959 Hypotension, unspecified: Secondary | ICD-10-CM | POA: Diagnosis not present

## 2018-02-11 DIAGNOSIS — E119 Type 2 diabetes mellitus without complications: Secondary | ICD-10-CM | POA: Diagnosis not present

## 2018-02-11 DIAGNOSIS — Z794 Long term (current) use of insulin: Secondary | ICD-10-CM | POA: Diagnosis not present

## 2018-02-11 DIAGNOSIS — I10 Essential (primary) hypertension: Secondary | ICD-10-CM | POA: Diagnosis not present

## 2018-02-11 DIAGNOSIS — E7800 Pure hypercholesterolemia, unspecified: Secondary | ICD-10-CM | POA: Diagnosis not present

## 2018-02-11 DIAGNOSIS — Z79899 Other long term (current) drug therapy: Secondary | ICD-10-CM | POA: Diagnosis not present

## 2018-02-15 ENCOUNTER — Telehealth (INDEPENDENT_AMBULATORY_CARE_PROVIDER_SITE_OTHER): Payer: Self-pay | Admitting: Internal Medicine

## 2018-02-15 DIAGNOSIS — K5732 Diverticulitis of large intestine without perforation or abscess without bleeding: Secondary | ICD-10-CM

## 2018-02-15 MED ORDER — CIPROFLOXACIN HCL 500 MG PO TABS: 500.0000 mg | ORAL_TABLET | Freq: Two times a day (BID) | ORAL | 0 refills | Status: DC

## 2018-02-15 MED ORDER — METRONIDAZOLE 500 MG PO TABS: 500.0000 mg | ORAL_TABLET | Freq: Two times a day (BID) | ORAL | 0 refills | Status: DC

## 2018-02-15 NOTE — Telephone Encounter (Signed)
Rx for Cipro and Flagyl sent to her pharmacy 

## 2018-02-17 DIAGNOSIS — E039 Hypothyroidism, unspecified: Secondary | ICD-10-CM | POA: Diagnosis not present

## 2018-02-17 DIAGNOSIS — G6289 Other specified polyneuropathies: Secondary | ICD-10-CM | POA: Diagnosis not present

## 2018-02-17 DIAGNOSIS — M109 Gout, unspecified: Secondary | ICD-10-CM | POA: Diagnosis not present

## 2018-02-17 DIAGNOSIS — Z6836 Body mass index (BMI) 36.0-36.9, adult: Secondary | ICD-10-CM | POA: Diagnosis not present

## 2018-02-17 DIAGNOSIS — R5383 Other fatigue: Secondary | ICD-10-CM | POA: Diagnosis not present

## 2018-02-17 DIAGNOSIS — K219 Gastro-esophageal reflux disease without esophagitis: Secondary | ICD-10-CM | POA: Diagnosis not present

## 2018-02-17 DIAGNOSIS — I10 Essential (primary) hypertension: Secondary | ICD-10-CM | POA: Diagnosis not present

## 2018-02-17 DIAGNOSIS — E1165 Type 2 diabetes mellitus with hyperglycemia: Secondary | ICD-10-CM | POA: Diagnosis not present

## 2018-02-18 DIAGNOSIS — E1140 Type 2 diabetes mellitus with diabetic neuropathy, unspecified: Secondary | ICD-10-CM | POA: Diagnosis not present

## 2018-02-18 DIAGNOSIS — K219 Gastro-esophageal reflux disease without esophagitis: Secondary | ICD-10-CM | POA: Diagnosis not present

## 2018-02-23 DIAGNOSIS — R609 Edema, unspecified: Secondary | ICD-10-CM | POA: Diagnosis not present

## 2018-02-23 DIAGNOSIS — E039 Hypothyroidism, unspecified: Secondary | ICD-10-CM | POA: Diagnosis not present

## 2018-02-23 DIAGNOSIS — M545 Low back pain: Secondary | ICD-10-CM | POA: Diagnosis not present

## 2018-02-23 DIAGNOSIS — I10 Essential (primary) hypertension: Secondary | ICD-10-CM | POA: Diagnosis not present

## 2018-02-23 DIAGNOSIS — M109 Gout, unspecified: Secondary | ICD-10-CM | POA: Diagnosis not present

## 2018-02-23 DIAGNOSIS — E1165 Type 2 diabetes mellitus with hyperglycemia: Secondary | ICD-10-CM | POA: Diagnosis not present

## 2018-02-23 DIAGNOSIS — Z6837 Body mass index (BMI) 37.0-37.9, adult: Secondary | ICD-10-CM | POA: Diagnosis not present

## 2018-02-23 DIAGNOSIS — K219 Gastro-esophageal reflux disease without esophagitis: Secondary | ICD-10-CM | POA: Diagnosis not present

## 2018-03-05 DIAGNOSIS — M109 Gout, unspecified: Secondary | ICD-10-CM | POA: Diagnosis not present

## 2018-03-05 DIAGNOSIS — Z6837 Body mass index (BMI) 37.0-37.9, adult: Secondary | ICD-10-CM | POA: Diagnosis not present

## 2018-03-19 DIAGNOSIS — E039 Hypothyroidism, unspecified: Secondary | ICD-10-CM | POA: Diagnosis not present

## 2018-03-19 DIAGNOSIS — I10 Essential (primary) hypertension: Secondary | ICD-10-CM | POA: Diagnosis not present

## 2018-03-19 DIAGNOSIS — E1165 Type 2 diabetes mellitus with hyperglycemia: Secondary | ICD-10-CM | POA: Diagnosis not present

## 2018-04-19 DIAGNOSIS — I10 Essential (primary) hypertension: Secondary | ICD-10-CM | POA: Diagnosis not present

## 2018-04-19 DIAGNOSIS — E1165 Type 2 diabetes mellitus with hyperglycemia: Secondary | ICD-10-CM | POA: Diagnosis not present

## 2018-04-19 DIAGNOSIS — E782 Mixed hyperlipidemia: Secondary | ICD-10-CM | POA: Diagnosis not present

## 2018-04-20 DIAGNOSIS — M25579 Pain in unspecified ankle and joints of unspecified foot: Secondary | ICD-10-CM | POA: Diagnosis not present

## 2018-04-20 DIAGNOSIS — M1990 Unspecified osteoarthritis, unspecified site: Secondary | ICD-10-CM | POA: Diagnosis not present

## 2018-04-20 DIAGNOSIS — M79672 Pain in left foot: Secondary | ICD-10-CM | POA: Diagnosis not present

## 2018-04-20 DIAGNOSIS — M10071 Idiopathic gout, right ankle and foot: Secondary | ICD-10-CM | POA: Diagnosis not present

## 2018-04-20 DIAGNOSIS — M79671 Pain in right foot: Secondary | ICD-10-CM | POA: Diagnosis not present

## 2018-04-20 DIAGNOSIS — M10072 Idiopathic gout, left ankle and foot: Secondary | ICD-10-CM | POA: Diagnosis not present

## 2018-05-17 DIAGNOSIS — I739 Peripheral vascular disease, unspecified: Secondary | ICD-10-CM | POA: Diagnosis not present

## 2018-05-17 DIAGNOSIS — M79671 Pain in right foot: Secondary | ICD-10-CM | POA: Diagnosis not present

## 2018-05-17 DIAGNOSIS — M10071 Idiopathic gout, right ankle and foot: Secondary | ICD-10-CM | POA: Diagnosis not present

## 2018-05-17 DIAGNOSIS — M79672 Pain in left foot: Secondary | ICD-10-CM | POA: Diagnosis not present

## 2018-05-17 DIAGNOSIS — E1140 Type 2 diabetes mellitus with diabetic neuropathy, unspecified: Secondary | ICD-10-CM | POA: Diagnosis not present

## 2018-05-17 DIAGNOSIS — M10072 Idiopathic gout, left ankle and foot: Secondary | ICD-10-CM | POA: Diagnosis not present

## 2018-05-20 DIAGNOSIS — E1140 Type 2 diabetes mellitus with diabetic neuropathy, unspecified: Secondary | ICD-10-CM | POA: Diagnosis not present

## 2018-05-20 DIAGNOSIS — E039 Hypothyroidism, unspecified: Secondary | ICD-10-CM | POA: Diagnosis not present

## 2018-05-20 DIAGNOSIS — E782 Mixed hyperlipidemia: Secondary | ICD-10-CM | POA: Diagnosis not present

## 2018-06-21 DIAGNOSIS — M545 Low back pain: Secondary | ICD-10-CM | POA: Diagnosis not present

## 2018-06-21 DIAGNOSIS — R609 Edema, unspecified: Secondary | ICD-10-CM | POA: Diagnosis not present

## 2018-06-21 DIAGNOSIS — M109 Gout, unspecified: Secondary | ICD-10-CM | POA: Diagnosis not present

## 2018-06-21 DIAGNOSIS — R944 Abnormal results of kidney function studies: Secondary | ICD-10-CM | POA: Diagnosis not present

## 2018-06-21 DIAGNOSIS — E039 Hypothyroidism, unspecified: Secondary | ICD-10-CM | POA: Diagnosis not present

## 2018-06-21 DIAGNOSIS — E782 Mixed hyperlipidemia: Secondary | ICD-10-CM | POA: Diagnosis not present

## 2018-06-21 DIAGNOSIS — F411 Generalized anxiety disorder: Secondary | ICD-10-CM | POA: Diagnosis not present

## 2018-06-21 DIAGNOSIS — E1165 Type 2 diabetes mellitus with hyperglycemia: Secondary | ICD-10-CM | POA: Diagnosis not present

## 2018-06-21 DIAGNOSIS — R079 Chest pain, unspecified: Secondary | ICD-10-CM | POA: Diagnosis not present

## 2018-06-21 DIAGNOSIS — I10 Essential (primary) hypertension: Secondary | ICD-10-CM | POA: Diagnosis not present

## 2018-07-05 DIAGNOSIS — Z6836 Body mass index (BMI) 36.0-36.9, adult: Secondary | ICD-10-CM | POA: Diagnosis not present

## 2018-07-05 DIAGNOSIS — B370 Candidal stomatitis: Secondary | ICD-10-CM | POA: Diagnosis not present

## 2018-07-20 DIAGNOSIS — E782 Mixed hyperlipidemia: Secondary | ICD-10-CM | POA: Diagnosis not present

## 2018-07-20 DIAGNOSIS — I10 Essential (primary) hypertension: Secondary | ICD-10-CM | POA: Diagnosis not present

## 2018-07-29 ENCOUNTER — Other Ambulatory Visit: Payer: Self-pay

## 2018-07-29 ENCOUNTER — Ambulatory Visit (INDEPENDENT_AMBULATORY_CARE_PROVIDER_SITE_OTHER): Payer: PPO | Admitting: Cardiovascular Disease

## 2018-07-29 ENCOUNTER — Encounter: Payer: Self-pay | Admitting: Cardiovascular Disease

## 2018-07-29 ENCOUNTER — Telehealth: Payer: Self-pay | Admitting: Cardiovascular Disease

## 2018-07-29 VITALS — BP 109/76 | HR 68 | Ht 67.5 in | Wt 228.8 lb

## 2018-07-29 DIAGNOSIS — Z01812 Encounter for preprocedural laboratory examination: Secondary | ICD-10-CM

## 2018-07-29 DIAGNOSIS — I209 Angina pectoris, unspecified: Secondary | ICD-10-CM

## 2018-07-29 DIAGNOSIS — E119 Type 2 diabetes mellitus without complications: Secondary | ICD-10-CM

## 2018-07-29 DIAGNOSIS — Z794 Long term (current) use of insulin: Secondary | ICD-10-CM

## 2018-07-29 DIAGNOSIS — R079 Chest pain, unspecified: Secondary | ICD-10-CM

## 2018-07-29 DIAGNOSIS — R0609 Other forms of dyspnea: Secondary | ICD-10-CM

## 2018-07-29 DIAGNOSIS — I10 Essential (primary) hypertension: Secondary | ICD-10-CM | POA: Diagnosis not present

## 2018-07-29 DIAGNOSIS — IMO0001 Reserved for inherently not codable concepts without codable children: Secondary | ICD-10-CM

## 2018-07-29 MED ORDER — METOPROLOL TARTRATE 100 MG PO TABS: 100.0000 mg | ORAL_TABLET | Freq: Once | ORAL | 0 refills | Status: DC

## 2018-07-29 NOTE — Progress Notes (Signed)
CARDIOLOGY CONSULT NOTE  Patient ID: Melanie Mathews MRN: [id number] DOB/AGE: November 08, 1945 73 y.o.  Admit date: (Not on file) Primary Physician: Selinda FlavinHoward, Kevin, MD Referring Physician: Roma KayserKatie Skillman PA-C  Reason for Consultation: chest pain  HPI: Melanie Mathews is a 73 y.o. female who is being seen today for the evaluation of chest pain at the request of Roma KayserKatie Skillman PA-C.  I reviewed notes from her PCP.  She has a history of GERD and follows with GI.  She also has hypertension and insulin-dependent diabetes mellitus.  I reviewed labs dated 06/21/2018: Hemoglobin 12.4, platelets 241, TSH 3.3, total cholesterol 158, triglycerides 139, HDL 47, LDL 83, BUN 18, creatinine 1.03, normal liver transaminases, A1c 6.4%.  I personally reviewed the ECG performed on 06/21/2018 which showed sinus bradycardia, 56 bpm.  There were no ischemic abnormalities noted.  She has been experiencing left-sided chest pain and exertional dyspnea for at least 1 to 2 years but it has increased in frequency and intensity over the last several months.  She experiences exertional dyspnea when walking from one room in her house to the next.  This does not occur consistently.  She also has left-sided chest pains which can occur both with and without exertion.  She has had palpitations for a few years but they are not associated with chest pain.  She also has right leg sciatica and right knee pain which is chronic.  She has bilateral feet neuropathy for which she takes gabapentin.  She denies syncope, orthopnea, and paroxysmal nocturnal dyspnea.  Social history: Her husband, Christen BameRonnie, passed away in January 2019.  He had also been my patient.  They had been married for 52 years.  Her daughter died of a brain infection in March 2020.  She was 72 years old.  She has a son.   Allergies  Allergen Reactions  . Sulfur Itching and Swelling  . Adhesive [Tape] Rash    Current Outpatient Medications  Medication Sig  Dispense Refill  . allopurinol (ZYLOPRIM) 300 MG tablet Take 1 tablet by mouth daily.    . Calcium Carb-Cholecalciferol (CALCIUM-VITAMIN D3) 500-400 MG-UNIT TABS Take 1 tablet by mouth daily.    . DULoxetine (CYMBALTA) 60 MG capsule Take 1 capsule by mouth 2 (two) times daily.     . furosemide (LASIX) 40 MG tablet Take 40 mg by mouth.    . gabapentin (NEURONTIN) 800 MG tablet Take 800-1,600 mg by mouth 2 (two) times daily. Patient states that she takes 2 by mouth in the morning, and 2 by mouth at night    . insulin glargine (LANTUS) 100 UNIT/ML injection Inject 40 Units into the skin daily. In am and 12 at hs    . levothyroxine (SYNTHROID, LEVOTHROID) 137 MCG tablet Take 125 mcg by mouth daily.     . metFORMIN (GLUCOPHAGE) 500 MG tablet Take 1 tablet by mouth 2 (two) times a day.    . metoprolol tartrate (LOPRESSOR) 50 MG tablet Take 1 tablet by mouth 2 (two) times a day.    Marland Kitchen. NEXIUM 40 MG capsule TAKE ONE CAPSULE ONCE DAILY (Patient taking differently: Take 40 mg by mouth daily. ) 30 capsule 11  . Probiotic Product (PROBIOTIC-10 PO) Take 1 tablet by mouth daily.    . simvastatin (ZOCOR) 40 MG tablet Take 1 tablet by mouth daily.    . traMADol (ULTRAM) 50 MG tablet Take 1 tablet by mouth 4 (four) times daily.    . TURMERIC PO Take  500 mg by mouth 2 (two) times daily.     No current facility-administered medications for this visit.     Past Medical History:  Diagnosis Date  . Anxiety   . Diabetes mellitus   . Hernia   . Hypertension   . Hypothyroidism     Past Surgical History:  Procedure Laterality Date  . ABDOMINAL HYSTERECTOMY    . CERVICAL FUSION    . CHOLECYSTECTOMY    . COLONOSCOPY    . COLONOSCOPY  07/03/2011   Procedure: COLONOSCOPY;  Surgeon: Rogene Houston, MD;  [facility];  Service: Endoscopy;  Laterality: N/A;  1030  . ESOPHAGEAL DILATION N/A 04/19/2015   Procedure: ESOPHAGEAL DILATION;  Surgeon: Rogene Houston, MD;  [facility];  Service:  Endoscopy;  Laterality: N/A;  . ESOPHAGOGASTRODUODENOSCOPY N/A 04/19/2015   Procedure: ESOPHAGOGASTRODUODENOSCOPY (EGD);  Surgeon: Rogene Houston, MD;  [facility];  Service: Endoscopy;  Laterality: N/A;  2:05  . KNEE SURGERY     Arthroscopic  . UPPER GASTROINTESTINAL ENDOSCOPY      Social History   Socioeconomic History  . Marital status: Married    Spouse name: Not on file  . Number of children: Not on file  . Years of education: Not on file  . Highest education level: Not on file  Occupational History  . Not on file  Social Needs  . Financial resource strain: Not on file  . Food insecurity    Worry: Not on file    Inability: Not on file  . Transportation needs    Medical: Not on file    Non-medical: Not on file  Tobacco Use  . Smoking status: Former Smoker    Packs/day: 0.50    Years: 3.00    Pack years: 1.50    Types: Cigarettes    Quit date: 06/09/1991    Years since quitting: 27.1  . Smokeless tobacco: Never Used  . Tobacco comment: Patient states that she smoked rare  Substance and Sexual Activity  . Alcohol use: No  . Drug use: No  . Sexual activity: Not on file  Lifestyle  . Physical activity    Days per week: Not on file    Minutes per session: Not on file  . Stress: Not on file  Relationships  . Social Herbalist on phone: Not on file    Gets together: Not on file    Attends religious service: Not on file    Active member of club or organization: Not on file    Attends meetings of clubs or organizations: Not on file    Relationship status: Not on file  . Intimate partner violence    Fear of current or ex partner: Not on file    Emotionally abused: Not on file    Physically abused: Not on file    Forced sexual activity: Not on file  Other Topics Concern  . Not on file  Social History Narrative  . Not on file     No family history of premature CAD in 1st degree relatives.  Current Meds  Medication Sig  . allopurinol  (ZYLOPRIM) 300 MG tablet Take 1 tablet by mouth daily.  . Calcium Carb-Cholecalciferol (CALCIUM-VITAMIN D3) 500-400 MG-UNIT TABS Take 1 tablet by mouth daily.  . DULoxetine (CYMBALTA) 60 MG capsule Take 1 capsule by mouth 2 (two) times daily.   . furosemide (LASIX) 40 MG tablet Take 40 mg by mouth.  Marland Kitchen  gabapentin (NEURONTIN) 800 MG tablet Take 800-1,600 mg by mouth 2 (two) times daily. Patient states that she takes 2 by mouth in the morning, and 2 by mouth at night  . insulin glargine (LANTUS) 100 UNIT/ML injection Inject 40 Units into the skin daily. In am and 12 at hs  . levothyroxine (SYNTHROID, LEVOTHROID) 137 MCG tablet Take 125 mcg by mouth daily.   . metFORMIN (GLUCOPHAGE) 500 MG tablet Take 1 tablet by mouth 2 (two) times a day.  . metoprolol tartrate (LOPRESSOR) 50 MG tablet Take 1 tablet by mouth 2 (two) times a day.  Marland Kitchen. NEXIUM 40 MG capsule TAKE ONE CAPSULE ONCE DAILY (Patient taking differently: Take 40 mg by mouth daily. )  . Probiotic Product (PROBIOTIC-10 PO) Take 1 tablet by mouth daily.  . simvastatin (ZOCOR) 40 MG tablet Take 1 tablet by mouth daily.  . traMADol (ULTRAM) 50 MG tablet Take 1 tablet by mouth 4 (four) times daily.  . TURMERIC PO Take 500 mg by mouth 2 (two) times daily.      Review of systems complete and found to be negative unless listed above in HPI    Physical exam Blood pressure 109/76, pulse 68, height 5' 7.5" (1.715 m), weight 228 lb 12.8 oz (103.8 kg), SpO2 99 %. General: NAD Neck: No JVD, no thyromegaly or thyroid nodule.  Lungs: Clear to auscultation bilaterally with normal respiratory effort. CV: Nondisplaced PMI. Regular rate and rhythm, normal S1/S2, no S3/S4, no murmur.  No peripheral edema.  No carotid bruit.    Abdomen: Soft, nontender, no distention.  Skin: Intact without lesions or rashes.  Neurologic: Alert and oriented x 3.  Psych: Normal affect. Extremities: No clubbing or cyanosis.  HEENT: Normal.   ECG: Most recent ECG reviewed.    Labs: Lab Results  Component Value Date/Time   CREATININE 1.40 (H) 01/28/2018 09:21 AM     Lipids: No results found for: LDLCALC, LDLDIRECT, CHOL, TRIG, HDL      ASSESSMENT AND PLAN:  1.  Chest pain and exertional dyspnea -She has several cardiovascular risk factors. I will order a 2-D echocardiogram with Doppler to evaluate cardiac structure, function, and regional wall motion. I will also obtain coronary CT angiography.  She is on Lopressor and simvastatin.  2.  Hypertension -Blood pressure is normal.  No changes to therapy.  3.  Insulin-dependent diabetes mellitus -A1c 6.4%.  On insulin and statin.    Disposition: Follow up in 2-3 months  Signed: Prentice DockerSuresh Koneswaran, M.D., F.A.C.C.  07/29/2018, 9:42 AM

## 2018-07-29 NOTE — Patient Instructions (Addendum)
Medication Instructions:  Continue all current medications.  Labwork:  BMET - order given today.    Please do just prior to CT.  Testing/Procedures:  Your physician has requested that you have an echocardiogram. Echocardiography is a painless test that uses sound waves to create images of your heart. It provides your doctor with information about the size and shape of your heart and how well your heart's chambers and valves are working. This procedure takes approximately one hour. There are no restrictions for this procedure.  Cardiac CT - done at [facility] will contact with results via phone or letter.    Follow-Up: 2-3 months   Any Other Special Instructions Will Be Listed Below (If Applicable).  If you need a refill on your cardiac medications before your next appointment, please call your pharmacy.  =============================================  Please arrive at the [facility] main entrance of [facility] at xx:xx AM (30-45 minutes prior to test start time)  [facility] [place], [place] [address] [phone]  Proceed to the [facility] Radiology Department (First Floor).  Please follow these instructions carefully (unless otherwise directed):  Hold all erectile dysfunction medications at least 48 hours prior to test.  On the Night Before the Test: . Be sure to Drink plenty of water. . Do not consume any caffeinated/decaffeinated beverages or chocolate 12 hours prior to your test. . Do not take any antihistamines 12 hours prior to your test. . If you take Metformin do not take 24 hours prior to test. . If the patient has contrast allergy: ? Patient will need a prescription for Prednisone and very clear instructions (as follows): 1. Prednisone 50 mg - take 13 hours prior to test 2. Take another Prednisone 50 mg 7 hours prior to test 3. Take another Prednisone 50 mg 1 hour prior to test 4. Take Benadryl 50 mg 1 hour  prior to test . Patient must complete all four doses of above prophylactic medications. . Patient will need a ride after test due to Benadryl.  On the Day of the Test: . Drink plenty of water. Do not drink any water within one hour of the test. . Do not eat any food 4 hours prior to the test. . You may take your regular medications prior to the test.  . Take metoprolol (Lopressor) two hours prior to test. (100mg ) . HOLD Furosemide/Hydrochlorothiazide morning of the test.       After the Test: . Drink plenty of water. . After receiving IV contrast, you may experience a mild flushed feeling. This is normal. . On occasion, you may experience a mild rash up to 24 hours after the test. This is not dangerous. If this occurs, you can take Benadryl 25 mg and increase your fluid intake. . If you experience trouble breathing, this can be serious. If it is severe call 911 IMMEDIATELY. If it is mild, please call our office. . If you take any of these medications: Glipizide/Metformin, Avandament, Glucavance, please do not take 48 hours after completing test.

## 2018-07-29 NOTE — Telephone Encounter (Signed)
°  Precert needed for: echo - chest pain, doe, angina pectoris  Location: [facility]    Date: August 19, 2018

## 2018-08-02 DIAGNOSIS — E1140 Type 2 diabetes mellitus with diabetic neuropathy, unspecified: Secondary | ICD-10-CM | POA: Diagnosis not present

## 2018-08-02 DIAGNOSIS — L110 Acquired keratosis follicularis: Secondary | ICD-10-CM | POA: Diagnosis not present

## 2018-08-02 DIAGNOSIS — M79671 Pain in right foot: Secondary | ICD-10-CM | POA: Diagnosis not present

## 2018-08-02 DIAGNOSIS — I739 Peripheral vascular disease, unspecified: Secondary | ICD-10-CM | POA: Diagnosis not present

## 2018-08-02 DIAGNOSIS — M79672 Pain in left foot: Secondary | ICD-10-CM | POA: Diagnosis not present

## 2018-08-18 ENCOUNTER — Telehealth (HOSPITAL_COMMUNITY): Payer: Self-pay | Admitting: Emergency Medicine

## 2018-08-18 DIAGNOSIS — I209 Angina pectoris, unspecified: Secondary | ICD-10-CM | POA: Diagnosis not present

## 2018-08-18 DIAGNOSIS — Z01812 Encounter for preprocedural laboratory examination: Secondary | ICD-10-CM | POA: Diagnosis not present

## 2018-08-18 DIAGNOSIS — R0609 Other forms of dyspnea: Secondary | ICD-10-CM | POA: Diagnosis not present

## 2018-08-18 NOTE — Telephone Encounter (Signed)
Left message on voicemail with name and callback number Diannie Willner RN Navigator Cardiac Imaging [facility] [phone] Office [phone] Cell  

## 2018-08-19 ENCOUNTER — Ambulatory Visit (INDEPENDENT_AMBULATORY_CARE_PROVIDER_SITE_OTHER): Payer: PPO

## 2018-08-19 ENCOUNTER — Other Ambulatory Visit: Payer: Self-pay

## 2018-08-19 DIAGNOSIS — I209 Angina pectoris, unspecified: Secondary | ICD-10-CM | POA: Diagnosis not present

## 2018-08-19 DIAGNOSIS — R0609 Other forms of dyspnea: Secondary | ICD-10-CM | POA: Diagnosis not present

## 2018-08-19 LAB — BASIC METABOLIC PANEL
BUN/Creatinine Ratio: 17 (calc) (ref 6–22)
BUN: 20 mg/dL (ref 7–25)
CO2: 32 mmol/L (ref 20–32)
Calcium: 8.8 mg/dL (ref 8.6–10.4)
Chloride: 99 mmol/L (ref 98–110)
Creat: 1.15 mg/dL — ABNORMAL HIGH (ref 0.60–0.93)
Glucose, Bld: 369 mg/dL — ABNORMAL HIGH (ref 65–139)
Potassium: 4 mmol/L (ref 3.5–5.3)
Sodium: 138 mmol/L (ref 135–146)

## 2018-08-20 ENCOUNTER — Ambulatory Visit (HOSPITAL_COMMUNITY)
Admission: RE | Admit: 2018-08-20 | Discharge: 2018-08-20 | Disposition: A | Discharge status: Home / Self Care | Payer: PPO | Source: Ambulatory Visit | Attending: Cardiovascular Disease | Admitting: Cardiovascular Disease

## 2018-08-20 ENCOUNTER — Other Ambulatory Visit: Payer: Self-pay

## 2018-08-20 DIAGNOSIS — I10 Essential (primary) hypertension: Secondary | ICD-10-CM | POA: Diagnosis not present

## 2018-08-20 DIAGNOSIS — E785 Hyperlipidemia, unspecified: Secondary | ICD-10-CM | POA: Diagnosis not present

## 2018-08-20 DIAGNOSIS — I209 Angina pectoris, unspecified: Secondary | ICD-10-CM | POA: Diagnosis not present

## 2018-08-20 DIAGNOSIS — Z006 Encounter for examination for normal comparison and control in clinical research program: Secondary | ICD-10-CM

## 2018-08-20 DIAGNOSIS — I2510 Atherosclerotic heart disease of native coronary artery without angina pectoris: Secondary | ICD-10-CM | POA: Diagnosis not present

## 2018-08-20 DIAGNOSIS — R0609 Other forms of dyspnea: Secondary | ICD-10-CM | POA: Insufficient documentation

## 2018-08-20 MED ORDER — NITROGLYCERIN 0.4 MG SL SUBL
SUBLINGUAL_TABLET | SUBLINGUAL | Status: AC
  Filled 2018-08-20: qty 2

## 2018-08-20 MED ORDER — IOHEXOL 350 MG/ML SOLN
80.0000 mL | Freq: Once | INTRAVENOUS | Status: AC | PRN
  Administered 2018-08-20: 14:00:00 80 mL via INTRAVENOUS

## 2018-08-20 MED ORDER — NITROGLYCERIN 0.4 MG SL SUBL
0.8000 mg | SUBLINGUAL_TABLET | Freq: Once | SUBLINGUAL | Status: AC
  Administered 2018-08-20: 0.8 mg via SUBLINGUAL
  Filled 2018-08-20: qty 25

## 2018-08-20 NOTE — Research (Signed)
Cadfem Informed Consent    Patient Name: Melanie Mathews   Subject met inclusion and exclusion criteria.  The informed consent form, study requirements and expectations were reviewed with the subject and questions and concerns were addressed prior to the signing of the consent form.  The subject verbalized understanding of the trail requirements.  The subject agreed to participate in the CADFEM trial and signed the informed consent.  The informed consent was obtained prior to performance of any protocol-specific procedures for the subject.  A copy of the signed informed consent was given to the subject and a copy was placed in the subject's medical record.   Neva Seat

## 2018-08-23 ENCOUNTER — Other Ambulatory Visit: Payer: Self-pay

## 2018-08-23 ENCOUNTER — Other Ambulatory Visit (HOSPITAL_COMMUNITY)
Admission: RE | Admit: 2018-08-23 | Discharge: 2018-08-23 | Disposition: A | Discharge status: Home / Self Care | Payer: PPO | Source: Ambulatory Visit | Attending: Cardiovascular Disease | Admitting: Cardiovascular Disease

## 2018-08-23 ENCOUNTER — Telehealth: Payer: Self-pay | Admitting: Cardiology

## 2018-08-23 ENCOUNTER — Other Ambulatory Visit (HOSPITAL_COMMUNITY): Payer: PPO

## 2018-08-23 ENCOUNTER — Other Ambulatory Visit: Payer: Self-pay | Admitting: Cardiovascular Disease

## 2018-08-23 ENCOUNTER — Encounter: Payer: Self-pay | Admitting: Cardiovascular Disease

## 2018-08-23 ENCOUNTER — Telehealth (INDEPENDENT_AMBULATORY_CARE_PROVIDER_SITE_OTHER): Payer: PPO | Admitting: Cardiovascular Disease

## 2018-08-23 VITALS — BP 141/83 | HR 69

## 2018-08-23 DIAGNOSIS — IMO0001 Reserved for inherently not codable concepts without codable children: Secondary | ICD-10-CM

## 2018-08-23 DIAGNOSIS — I2510 Atherosclerotic heart disease of native coronary artery without angina pectoris: Secondary | ICD-10-CM | POA: Diagnosis not present

## 2018-08-23 DIAGNOSIS — R0609 Other forms of dyspnea: Secondary | ICD-10-CM

## 2018-08-23 DIAGNOSIS — Z01818 Encounter for other preprocedural examination: Secondary | ICD-10-CM

## 2018-08-23 DIAGNOSIS — I25118 Atherosclerotic heart disease of native coronary artery with other forms of angina pectoris: Secondary | ICD-10-CM

## 2018-08-23 DIAGNOSIS — I10 Essential (primary) hypertension: Secondary | ICD-10-CM

## 2018-08-23 DIAGNOSIS — I209 Angina pectoris, unspecified: Secondary | ICD-10-CM

## 2018-08-23 DIAGNOSIS — Z01812 Encounter for preprocedural laboratory examination: Secondary | ICD-10-CM | POA: Insufficient documentation

## 2018-08-23 DIAGNOSIS — R079 Chest pain, unspecified: Secondary | ICD-10-CM

## 2018-08-23 DIAGNOSIS — Z20828 Contact with and (suspected) exposure to other viral communicable diseases: Secondary | ICD-10-CM | POA: Insufficient documentation

## 2018-08-23 LAB — CBC WITH DIFFERENTIAL/PLATELET
Abs Immature Granulocytes: 0.01 10*3/uL (ref 0.00–0.07)
Basophils Absolute: 0 10*3/uL (ref 0.0–0.1)
Basophils Relative: 1 %
Eosinophils Absolute: 0.2 10*3/uL (ref 0.0–0.5)
Eosinophils Relative: 3 %
HCT: 39.7 % (ref 36.0–46.0)
Hemoglobin: 12.4 g/dL (ref 12.0–15.0)
Immature Granulocytes: 0 %
Lymphocytes Relative: 28 %
Lymphs Abs: 1.8 10*3/uL (ref 0.7–4.0)
MCH: 28.2 pg (ref 26.0–34.0)
MCHC: 31.2 g/dL (ref 30.0–36.0)
MCV: 90.4 fL (ref 80.0–100.0)
Monocytes Absolute: 0.4 10*3/uL (ref 0.1–1.0)
Monocytes Relative: 6 %
Neutro Abs: 4.2 10*3/uL (ref 1.7–7.7)
Neutrophils Relative %: 62 %
Platelets: 242 10*3/uL (ref 150–400)
RBC: 4.39 MIL/uL (ref 3.87–5.11)
RDW: 14.1 % (ref 11.5–15.5)
WBC: 6.6 10*3/uL (ref 4.0–10.5)
nRBC: 0 % (ref 0.0–0.2)

## 2018-08-23 LAB — SARS CORONAVIRUS 2 (TAT 6-24 HRS): SARS Coronavirus 2: NEGATIVE

## 2018-08-23 LAB — PROTIME-INR
INR: 0.9 (ref 0.8–1.2)
Prothrombin Time: 12.3 seconds (ref 11.4–15.2)

## 2018-08-23 MED ORDER — SODIUM CHLORIDE 0.9% FLUSH: 3.0000 mL | Freq: Two times a day (BID) | INTRAVENOUS | Status: AC

## 2018-08-23 NOTE — Telephone Encounter (Signed)
New Message     Pt is calling about an upcoming procedure that she has and she is needing instructions     Please call

## 2018-08-23 NOTE — Progress Notes (Signed)
Virtual Visit via Telephone Note   This visit type was conducted due to national recommendations for restrictions regarding the COVID-19 Pandemic (e.g. social distancing) in an effort to limit this patient's exposure and mitigate transmission in our community.  Due to her co-morbid illnesses, this patient is at least at moderate risk for complications without adequate follow up.  This format is felt to be most appropriate for this patient at this time.  The patient did not have access to video technology/had technical difficulties with video requiring transitioning to audio format only (telephone).  All issues noted in this document were discussed and addressed.  No physical exam could be performed with this format.  Please refer to the patient's chart for her  consent to telehealth for [facility].   Date:  08/23/2018   ID:  Melanie Mathews, Melanie Mathews [birth year], MRN [id number]  Patient Location: Home Provider Location: Office  PCP:  Rory Percy, MD  Cardiologist:  Kate Sable, MD  Electrophysiologist:  None   Evaluation Performed:  Follow-Up Visit  Chief Complaint:  Chest pain  History of Present Illness:    Melanie Mathews is a 73 y.o. female with chest pain.  I initially evaluated her on 07/29/2018.  She also has hypertension and insulin-dependent diabetes mellitus.  She has been experiencing left-sided chest pain and exertional dyspnea for at least 1 to 2 years but it has increased in frequency and intensity over the last several months.  She experiences exertional dyspnea when walking from one room in her house to the next.  This does not occur consistently.  She also has left-sided chest pains which can occur both with and without exertion.  She has had palpitations for a few years but they are not associated with chest pain.  In order to evaluate further, I ordered an echocardiogram and coronary CT angiography.  She continues to have mild exertional symptoms when walking to  the mailbox.  The patient does not have symptoms concerning for COVID-19 infection (fever, chills, cough).   Social history: Her husband, Edd Arbour, passed away in 02-24-2017.  He had also been my patient.  They had been married for 52 years.  Her daughter died of a brain infection in 04/25/2018.  She was 72 years old.  She has a son.   Past Medical History:  Diagnosis Date  . Anxiety   . Diabetes mellitus   . Hernia   . Hypertension   . Hypothyroidism    Past Surgical History:  Procedure Laterality Date  . ABDOMINAL HYSTERECTOMY    . CERVICAL FUSION    . CHOLECYSTECTOMY    . COLONOSCOPY    . COLONOSCOPY  07/03/2011   Procedure: COLONOSCOPY;  Surgeon: Rogene Houston, MD;  [facility];  Service: Endoscopy;  Laterality: N/A;  1030  . ESOPHAGEAL DILATION N/A 04/19/2015   Procedure: ESOPHAGEAL DILATION;  Surgeon: Rogene Houston, MD;  [facility];  Service: Endoscopy;  Laterality: N/A;  . ESOPHAGOGASTRODUODENOSCOPY N/A 04/19/2015   Procedure: ESOPHAGOGASTRODUODENOSCOPY (EGD);  Surgeon: Rogene Houston, MD;  [facility];  Service: Endoscopy;  Laterality: N/A;  2:05  . KNEE SURGERY     Arthroscopic  . UPPER GASTROINTESTINAL ENDOSCOPY       Current Meds  Medication Sig  . allopurinol (ZYLOPRIM) 300 MG tablet Take 1 tablet by mouth daily.  . Calcium Carb-Cholecalciferol (CALCIUM-VITAMIN D3) 500-400 MG-UNIT TABS Take 1 tablet by mouth daily.  . DULoxetine (CYMBALTA) 60 MG  capsule Take 1 capsule by mouth 2 (two) times daily.   . furosemide (LASIX) 40 MG tablet Take 40 mg by mouth.  . gabapentin (NEURONTIN) 800 MG tablet Take 800-1,600 mg by mouth 2 (two) times daily. Patient states that she takes 2 by mouth in the morning, and 2 by mouth at night  . insulin glargine (LANTUS) 100 UNIT/ML injection Inject 40 Units into the skin daily. In am and 12 at hs  . levothyroxine (SYNTHROID, LEVOTHROID) 137 MCG tablet Take 125 mcg by mouth daily.   . metFORMIN  (GLUCOPHAGE) 500 MG tablet Take 1 tablet by mouth 2 (two) times a day.  . metoprolol tartrate (LOPRESSOR) 50 MG tablet Take 1 tablet by mouth 2 (two) times a day.  . NEXIUM 40 MG capsule TAKE ONE CAPSULE ONCE DAILY (Patient taking differently: Take 40 mg by mouth daily. )  . Probiotic Product (PROBIOTIC-10 PO) Take 1 tablet by mouth daily.  . simvastatin (ZOCOR) 40 MG tablet Take 1 tablet by mouth daily.  . traMADol (ULTRAM) 50 MG tablet Take 1 tablet by mouth 4 (four) times daily.  . TURMERIC PO Take 500 mg by mouth 2 (two) times daily.     Allergies:   Sulfur and Adhesive [tape]   Social History   Tobacco Use  . Smoking status: Former Smoker    Packs/day: 0.50    Years: 3.00    Pack years: 1.50    Types: Cigarettes    Quit date: 06/09/1991    Years since quitting: 27.2  . Smokeless tobacco: Never Used  . Tobacco comment: Patient states that she smoked rare  Substance Use Topics  . Alcohol use: No  . Drug use: No     Family Hx: The patient's family history includes Arthritis in her mother and sister; COPD in her sister; Diabetes in her sister; Pancreatic cancer in her father.  ROS:   Please see the history of present illness.     All other systems reviewed and are negative.   Prior CV studies:   The following studies were reviewed today:  Echocardiogram 08/19/2018:   1. The left ventricle has normal systolic function with an ejection fraction of 60-65%. The cavity size was normal. Left ventricular diastolic Doppler parameters are consistent with impaired relaxation. Indeterminate filling pressures.  2. The right ventricle has normal systolic function. The cavity was normal. There is no increase in right ventricular wall thickness.  3. The mitral valve is grossly normal.  4. The tricuspid valve is grossly normal.  5. The aortic valve was not well visualized. Aortic valve regurgitation is mild by color flow Doppler.  6. The aorta is normal in size and structure.  7. The  interatrial septum was not assessed.  8. Technically difficult study due to body habitus.  Cardiac CT 08/20/2018:  Pulmonary veins drain normally to the left atrium.  Calcium Score: 118 Agatston units.  Coronary Arteries: Right dominant with no anomalies  LM: No plaque or stenosis.  LAD system: Extensive mixed plaque in the proximal LAD, possible moderate (51-69%) stenosis.  Circumflex system: No plaque or stenosis.  RCA system:  No plaque or stenosis.  IMPRESSION: 1. Coronary artery calcium score 118 Agatston units. This places the patient in the 72nd percentile for age and gender, suggesting intermediate risk for future cardiac events.  2. Possible moderate stenosis in the proximal LAD. Will send for FFR to assess hemodynamic significance.   Cardiac CT FFR 08/20/2018:  FINDINGS: FFR < 0.5 mid LAD.    IMPRESSION: The proximal LAD stenosis appears hemodynamically significant. Recommend cardiac cath.     Labs/Other Tests and Data Reviewed:    EKG:  No ECG reviewed.  Recent Labs: 08/18/2018: BUN 20; Creat 1.15; Potassium 4.0; Sodium 138   Recent Lipid Panel No results found for: CHOL, TRIG, HDL, CHOLHDL, LDLCALC, LDLDIRECT  Wt Readings from Last 3 Encounters:  07/29/18 228 lb 12.8 oz (103.8 kg)  01/28/18 227 lb 6.4 oz (103.1 kg)  04/17/15 228 lb 4.8 oz (103.6 kg)     Objective:    Vital Signs:  BP (!) 141/83   Pulse 69    VITAL SIGNS:  reviewed  ASSESSMENT & PLAN:    1.  Chest pain and exertional dyspnea/coronary artery disease: Coronary CT angiography reviewed above with potential proximal LAD disease which is hemodynamically significant.  She is already on metoprolol and simvastatin.  I will start aspirin 81 mg daily.  I will arrange for coronary angiography. Risks and benefits of cardiac catheterization have been discussed with the patient.  These include bleeding, infection, kidney damage, stroke, heart attack, death.  The patient understands  these risks and is willing to proceed.  2.  Hypertension: Blood pressure is mildly elevated.  No changes today.  3. Insulin-dependent diabetes mellitus: A1c 6.4%.  On insulin and statin.   COVID-19 Education: The signs and symptoms of COVID-19 were discussed with the patient and how to seek care for testing (follow up with PCP or arrange E-visit).  The importance of social distancing was discussed today.  Time:   Today, I have spent 25 minutes with the patient with telehealth technology discussing the above problems.     Medication Adjustments/Labs and Tests Ordered: Current medicines are reviewed at length with the patient today.  Concerns regarding medicines are outlined above.   Tests Ordered: No orders of the defined types were placed in this encounter.   Medication Changes: No orders of the defined types were placed in this encounter.   Follow Up:  Virtual Visit or In Person in 1 month(s)  Signed, Prentice DockerSuresh Virjean Boman, MD  08/23/2018 12:41 PM    [facility]

## 2018-08-23 NOTE — Addendum Note (Signed)
Addended by: Barbarann Ehlers A on: 08/23/2018 01:48 PM   Modules accepted: Orders

## 2018-08-23 NOTE — Patient Instructions (Addendum)
    [place] [address] [address] Dept: [phone] Loc: [phone]  Melanie Mathews  08/23/2018  You are scheduled for a Cardiac Catheterization on Wednesday, August 5 with Dr. Glenetta Hew.  1. Please arrive at the [facility] (Main Entrance A) at [facility]: [address] [place], [place] [address] at 9:30 AM (This time is two hours before your procedure to ensure your preparation). Free valet parking service is available.   Special note: Every effort is made to have your procedure done on time. Please understand that emergencies sometimes delay scheduled procedures.  2. Diet: Do not eat solid foods after midnight.  The patient may have clear liquids until 5am upon the day of the procedure.  3. Labs: You will need to have blood drawn on Monday, 08/23/2018 at [place] [facility] TO GET YOUR COVID 19 NASAL SWAB DONE TODAY   4. Medication instructions in preparation for your procedure:   Contrast Allergy: No     HOLD Lasix the am of cath   Take only 6 units of insulin the night before your procedure. Do not take any insulin on the day of the procedure.  Do not take Diabetes Med Glucophage (Metformin) on the day of the procedure and HOLD 48 HOURS AFTER THE PROCEDURE.  On the morning of your procedure, take your Aspirin 81 mg  and any morning medicines NOT listed above.  You may use sips of water.  5. Plan for one night stay--bring personal belongings. 6. Bring a current list of your medications and current insurance cards. 7. You MUST have a responsible person to drive you home. 8. Someone MUST be with you the first 24 hours after you arrive home or your discharge will be delayed. 9. Please wear clothes that are easy to get on and off and wear slip-on shoes.  Thank you for allowing [place] to care for you!   --  [facility]    Follow up in 1 month after cath on 9/18 at 1:20 pm with Dr.Koneswaran

## 2018-08-23 NOTE — H&P (View-Only) (Signed)
Virtual Visit via Telephone Note   This visit type was conducted due to national recommendations for restrictions regarding the COVID-19 Pandemic (e.g. social distancing) in an effort to limit this patient's exposure and mitigate transmission in our community.  Due to her co-morbid illnesses, this patient is at least at moderate risk for complications without adequate follow up.  This format is felt to be most appropriate for this patient at this time.  The patient did not have access to video technology/had technical difficulties with video requiring transitioning to audio format only (telephone).  All issues noted in this document were discussed and addressed.  No physical exam could be performed with this format.  Please refer to the patient's chart for her  consent to telehealth for [facility].   Date:  08/23/2018   ID:  Melanie Mathews, Melanie Mathews [birth year], MRN [id number]  Patient Location: Home Provider Location: Office  PCP:  Rory Percy, MD  Cardiologist:  Kate Sable, MD  Electrophysiologist:  None   Evaluation Performed:  Follow-Up Visit  Chief Complaint:  Chest pain  History of Present Illness:    Melanie Mathews is a 73 y.o. female with chest pain.  I initially evaluated her on 07/29/2018.  She also has hypertension and insulin-dependent diabetes mellitus.  She has been experiencing left-sided chest pain and exertional dyspnea for at least 1 to 2 years but it has increased in frequency and intensity over the last several months.  She experiences exertional dyspnea when walking from one room in her house to the next.  This does not occur consistently.  She also has left-sided chest pains which can occur both with and without exertion.  She has had palpitations for a few years but they are not associated with chest pain.  In order to evaluate further, I ordered an echocardiogram and coronary CT angiography.  She continues to have mild exertional symptoms when walking to  the mailbox.  The patient does not have symptoms concerning for COVID-19 infection (fever, chills, cough).   Social history: Her husband, Edd Arbour, passed away in 02-24-2017.  He had also been my patient.  They had been married for 52 years.  Her daughter died of a brain infection in 04/25/2018.  She was 72 years old.  She has a son.   Past Medical History:  Diagnosis Date  . Anxiety   . Diabetes mellitus   . Hernia   . Hypertension   . Hypothyroidism    Past Surgical History:  Procedure Laterality Date  . ABDOMINAL HYSTERECTOMY    . CERVICAL FUSION    . CHOLECYSTECTOMY    . COLONOSCOPY    . COLONOSCOPY  07/03/2011   Procedure: COLONOSCOPY;  Surgeon: Rogene Houston, MD;  Location: [facility];  Service: Endoscopy;  Laterality: N/A;  1030  . ESOPHAGEAL DILATION N/A 04/19/2015   Procedure: ESOPHAGEAL DILATION;  Surgeon: Rogene Houston, MD;  Location: [facility];  Service: Endoscopy;  Laterality: N/A;  . ESOPHAGOGASTRODUODENOSCOPY N/A 04/19/2015   Procedure: ESOPHAGOGASTRODUODENOSCOPY (EGD);  Surgeon: Rogene Houston, MD;  Location: [facility];  Service: Endoscopy;  Laterality: N/A;  2:05  . KNEE SURGERY     Arthroscopic  . UPPER GASTROINTESTINAL ENDOSCOPY       Current Meds  Medication Sig  . allopurinol (ZYLOPRIM) 300 MG tablet Take 1 tablet by mouth daily.  . Calcium Carb-Cholecalciferol (CALCIUM-VITAMIN D3) 500-400 MG-UNIT TABS Take 1 tablet by mouth daily.  . DULoxetine (CYMBALTA) 60 MG  capsule Take 1 capsule by mouth 2 (two) times daily.   . furosemide (LASIX) 40 MG tablet Take 40 mg by mouth.  . gabapentin (NEURONTIN) 800 MG tablet Take 800-1,600 mg by mouth 2 (two) times daily. Patient states that she takes 2 by mouth in the morning, and 2 by mouth at night  . insulin glargine (LANTUS) 100 UNIT/ML injection Inject 40 Units into the skin daily. In am and 12 at hs  . levothyroxine (SYNTHROID, LEVOTHROID) 137 MCG tablet Take 125 mcg by mouth daily.   . metFORMIN  (GLUCOPHAGE) 500 MG tablet Take 1 tablet by mouth 2 (two) times a day.  . metoprolol tartrate (LOPRESSOR) 50 MG tablet Take 1 tablet by mouth 2 (two) times a day.  Marland Kitchen. NEXIUM 40 MG capsule TAKE ONE CAPSULE ONCE DAILY (Patient taking differently: Take 40 mg by mouth daily. )  . Probiotic Product (PROBIOTIC-10 PO) Take 1 tablet by mouth daily.  . simvastatin (ZOCOR) 40 MG tablet Take 1 tablet by mouth daily.  . traMADol (ULTRAM) 50 MG tablet Take 1 tablet by mouth 4 (four) times daily.  . TURMERIC PO Take 500 mg by mouth 2 (two) times daily.     Allergies:   Sulfur and Adhesive [tape]   Social History   Tobacco Use  . Smoking status: Former Smoker    Packs/day: 0.50    Years: 3.00    Pack years: 1.50    Types: Cigarettes    Quit date: 06/09/1991    Years since quitting: 27.2  . Smokeless tobacco: Never Used  . Tobacco comment: Patient states that she smoked rare  Substance Use Topics  . Alcohol use: No  . Drug use: No     Family Hx: The patient's family history includes Arthritis in her mother and sister; COPD in her sister; Diabetes in her sister; Pancreatic cancer in her father.  ROS:   Please see the history of present illness.     All other systems reviewed and are negative.   Prior CV studies:   The following studies were reviewed today:  Echocardiogram 08/19/2018:   1. The left ventricle has normal systolic function with an ejection fraction of 60-65%. The cavity size was normal. Left ventricular diastolic Doppler parameters are consistent with impaired relaxation. Indeterminate filling pressures.  2. The right ventricle has normal systolic function. The cavity was normal. There is no increase in right ventricular wall thickness.  3. The mitral valve is grossly normal.  4. The tricuspid valve is grossly normal.  5. The aortic valve was not well visualized. Aortic valve regurgitation is mild by color flow Doppler.  6. The aorta is normal in size and structure.  7. The  interatrial septum was not assessed.  8. Technically difficult study due to body habitus.  Cardiac CT 08/20/2018:  Pulmonary veins drain normally to the left atrium.  Calcium Score: 118 Agatston units.  Coronary Arteries: Right dominant with no anomalies  LM: No plaque or stenosis.  LAD system: Extensive mixed plaque in the proximal LAD, possible moderate (51-69%) stenosis.  Circumflex system: No plaque or stenosis.  RCA system:  No plaque or stenosis.  IMPRESSION: 1. Coronary artery calcium score 118 Agatston units. This places the patient in the 72nd percentile for age and gender, suggesting intermediate risk for future cardiac events.  2. Possible moderate stenosis in the proximal LAD. Will send for FFR to assess hemodynamic significance.   Cardiac CT FFR 08/20/2018:  FINDINGS: FFR < 0.5 mid LAD.  IMPRESSION: The proximal LAD stenosis appears hemodynamically significant. Recommend cardiac cath.     Labs/Other Tests and Data Reviewed:    EKG:  No ECG reviewed.  Recent Labs: 08/18/2018: BUN 20; Creat 1.15; Potassium 4.0; Sodium 138   Recent Lipid Panel No results found for: CHOL, TRIG, HDL, CHOLHDL, LDLCALC, LDLDIRECT  Wt Readings from Last 3 Encounters:  07/29/18 228 lb 12.8 oz (103.8 kg)  01/28/18 227 lb 6.4 oz (103.1 kg)  04/17/15 228 lb 4.8 oz (103.6 kg)     Objective:    Vital Signs:  BP (!) 141/83   Pulse 69    VITAL SIGNS:  reviewed  ASSESSMENT & PLAN:    1.  Chest pain and exertional dyspnea/coronary artery disease: Coronary CT angiography reviewed above with potential proximal LAD disease which is hemodynamically significant.  She is already on metoprolol and simvastatin.  I will start aspirin 81 mg daily.  I will arrange for coronary angiography. Risks and benefits of cardiac catheterization have been discussed with the patient.  These include bleeding, infection, kidney damage, stroke, heart attack, death.  The patient understands  these risks and is willing to proceed.  2.  Hypertension: Blood pressure is mildly elevated.  No changes today.  3. Insulin-dependent diabetes mellitus: A1c 6.4%.  On insulin and statin.   COVID-19 Education: The signs and symptoms of COVID-19 were discussed with the patient and how to seek care for testing (follow up with PCP or arrange E-visit).  The importance of social distancing was discussed today.  Time:   Today, I have spent 25 minutes with the patient with telehealth technology discussing the above problems.     Medication Adjustments/Labs and Tests Ordered: Current medicines are reviewed at length with the patient today.  Concerns regarding medicines are outlined above.   Tests Ordered: No orders of the defined types were placed in this encounter.   Medication Changes: No orders of the defined types were placed in this encounter.   Follow Up:  Virtual Visit or In Person in 1 month(s)  Signed, Prentice DockerSuresh Shinita Mac, MD  08/23/2018 12:41 PM    [facility]

## 2018-08-24 ENCOUNTER — Telehealth: Payer: Self-pay | Admitting: *Deleted

## 2018-08-24 NOTE — Telephone Encounter (Signed)
Pt contacted pre-catheterization scheduled at [facility] for: Wednesday August 25, 2018 11:30 AM Verified arrival time and place: [place] [facility]) at: 9:30 AM   No solid food after midnight prior to cath, clear liquids until 5 AM day of procedure. Contrast allergy: no   Hold: Metformin-day of procedure and 48 hours post procedure. Insulin-AM of procedure-PM prior 1/2 usual dose. Lasix-AM of procedure.  Except hold medications AM meds can be  taken pre-cath with sip of water including: ASA 81 mg   Confirmed patient has responsible person to drive home post procedure and observe 24 hours after arriving home: yes  Due to Covid-19 pandemic, only one support person will be allowed with patient. Must be the same support person for that patient's entire stay, will be screened and required to wear a mask.   Patients are required to wear a mask when they enter the hospital.      COVID-19 Pre-Screening Questions:  . In the past 7 to 10 days have you had a cough,  shortness of breath, headache, congestion, fever (100 or greater) body aches, chills, sore throat, or sudden loss of taste or sense of smell? sore throat last week-none now . Have you been around anyone with known Covid 19? no . Have you been around anyone who is awaiting Covid 19 test results in the past 7 to 10 days? no . Have you been around anyone who has been exposed to Covid 19, or has mentioned symptoms of Covid 19 within the past 7 to 10 days? No  I reviewed procedure/mask/visitor, Covid-19 screening questions with patient, she verbalized understanding, thanked me for call.

## 2018-08-24 NOTE — Telephone Encounter (Signed)
BMET -  Notes recorded by Herminio Commons, MD on 08/19/2018 at 8:41 AM EDT  Improved renal function. Blood sugar very high. Copy to Dr. Nadara Mustard.  (Patient stated that she was not fasting.)  ECHO - Notes recorded by Herminio Commons, MD on 08/19/2018 at 12:37 PM EDT  Normal pumping function.  CORONARY CT  Notes recorded by Herminio Commons, MD on 08/21/2018 at 3:06 PM EDT  Await additional CT studies done for possible blockage.

## 2018-08-24 NOTE — Telephone Encounter (Signed)
Notes recorded by Laurine Blazer, LPN on 02/22/6331 at 5:45 PM EDT  Patient notified. Copy to pmd. Heart cath already scheduled for 08/25/2018.

## 2018-08-25 ENCOUNTER — Other Ambulatory Visit: Payer: Self-pay

## 2018-08-25 ENCOUNTER — Ambulatory Visit (HOSPITAL_COMMUNITY)
Admission: RE | Admit: 2018-08-25 | Discharge: 2018-08-25 | Disposition: A | Discharge status: Home / Self Care | Payer: PPO | Attending: Cardiology | Admitting: Cardiology

## 2018-08-25 ENCOUNTER — Encounter (HOSPITAL_COMMUNITY)
Admission: RE | Disposition: A | Discharge status: Home / Self Care | Payer: PPO | Source: Home / Self Care | Attending: Cardiology

## 2018-08-25 DIAGNOSIS — I25110 Atherosclerotic heart disease of native coronary artery with unstable angina pectoris: Secondary | ICD-10-CM

## 2018-08-25 DIAGNOSIS — Z7982 Long term (current) use of aspirin: Secondary | ICD-10-CM | POA: Insufficient documentation

## 2018-08-25 DIAGNOSIS — I10 Essential (primary) hypertension: Secondary | ICD-10-CM | POA: Diagnosis present

## 2018-08-25 DIAGNOSIS — Z79890 Hormone replacement therapy (postmenopausal): Secondary | ICD-10-CM | POA: Diagnosis not present

## 2018-08-25 DIAGNOSIS — E119 Type 2 diabetes mellitus without complications: Secondary | ICD-10-CM

## 2018-08-25 DIAGNOSIS — R0609 Other forms of dyspnea: Secondary | ICD-10-CM | POA: Diagnosis not present

## 2018-08-25 DIAGNOSIS — Z87891 Personal history of nicotine dependence: Secondary | ICD-10-CM | POA: Diagnosis not present

## 2018-08-25 DIAGNOSIS — I200 Unstable angina: Secondary | ICD-10-CM | POA: Clinically undetermined

## 2018-08-25 DIAGNOSIS — Z833 Family history of diabetes mellitus: Secondary | ICD-10-CM | POA: Diagnosis not present

## 2018-08-25 DIAGNOSIS — I25118 Atherosclerotic heart disease of native coronary artery with other forms of angina pectoris: Secondary | ICD-10-CM

## 2018-08-25 DIAGNOSIS — Z79899 Other long term (current) drug therapy: Secondary | ICD-10-CM | POA: Diagnosis not present

## 2018-08-25 DIAGNOSIS — E1169 Type 2 diabetes mellitus with other specified complication: Secondary | ICD-10-CM | POA: Diagnosis present

## 2018-08-25 DIAGNOSIS — E039 Hypothyroidism, unspecified: Secondary | ICD-10-CM | POA: Insufficient documentation

## 2018-08-25 DIAGNOSIS — I2510 Atherosclerotic heart disease of native coronary artery without angina pectoris: Secondary | ICD-10-CM | POA: Diagnosis not present

## 2018-08-25 DIAGNOSIS — Z90710 Acquired absence of both cervix and uterus: Secondary | ICD-10-CM | POA: Insufficient documentation

## 2018-08-25 DIAGNOSIS — R931 Abnormal findings on diagnostic imaging of heart and coronary circulation: Secondary | ICD-10-CM | POA: Diagnosis present

## 2018-08-25 DIAGNOSIS — Z794 Long term (current) use of insulin: Secondary | ICD-10-CM | POA: Diagnosis not present

## 2018-08-25 HISTORY — PX: LEFT HEART CATH AND CORONARY ANGIOGRAPHY: CATH118249

## 2018-08-25 LAB — GLUCOSE, CAPILLARY
Glucose-Capillary: 66 mg/dL — ABNORMAL LOW (ref 70–99)
Glucose-Capillary: 115 mg/dL — ABNORMAL HIGH (ref 70–99)

## 2018-08-25 LAB — POCT ACTIVATED CLOTTING TIME: Activated Clotting Time: 263 seconds

## 2018-08-25 SURGERY — LEFT HEART CATH AND CORONARY ANGIOGRAPHY
Anesthesia: LOCAL

## 2018-08-25 MED ORDER — HEPARIN (PORCINE) IN NACL 1000-0.9 UT/500ML-% IV SOLN
INTRAVENOUS | Status: DC | PRN
  Administered 2018-08-25 (×2): 500 mL

## 2018-08-25 MED ORDER — ADENOSINE (DIAGNOSTIC) 140MCG/KG/MIN
INTRAVENOUS | Status: DC | PRN
  Administered 2018-08-25: 140 ug/kg/min via INTRAVENOUS

## 2018-08-25 MED ORDER — ADENOSINE 12 MG/4ML IV SOLN
INTRAVENOUS | Status: AC
  Filled 2018-08-25: qty 16

## 2018-08-25 MED ORDER — HEPARIN SODIUM (PORCINE) 1000 UNIT/ML IJ SOLN
INTRAMUSCULAR | Status: AC
  Filled 2018-08-25: qty 1

## 2018-08-25 MED ORDER — HEPARIN (PORCINE) IN NACL 1000-0.9 UT/500ML-% IV SOLN
INTRAVENOUS | Status: AC
  Filled 2018-08-25: qty 1500

## 2018-08-25 MED ORDER — LIDOCAINE HCL (PF) 1 % IJ SOLN
INTRAMUSCULAR | Status: DC | PRN
  Administered 2018-08-25: 2 mL via INTRADERMAL

## 2018-08-25 MED ORDER — MIDAZOLAM HCL 2 MG/2ML IJ SOLN
INTRAMUSCULAR | Status: AC
  Filled 2018-08-25: qty 2

## 2018-08-25 MED ORDER — FENTANYL CITRATE (PF) 100 MCG/2ML IJ SOLN
INTRAMUSCULAR | Status: AC
  Filled 2018-08-25: qty 2

## 2018-08-25 MED ORDER — LIDOCAINE HCL (PF) 1 % IJ SOLN
INTRAMUSCULAR | Status: AC
  Filled 2018-08-25: qty 30

## 2018-08-25 MED ORDER — IOHEXOL 350 MG/ML SOLN
INTRAVENOUS | Status: DC | PRN
  Administered 2018-08-25: 90 mL via INTRA_ARTERIAL

## 2018-08-25 MED ORDER — SODIUM CHLORIDE 0.9 % IV SOLN: 250.0000 mL | INTRAVENOUS | Status: DC | PRN

## 2018-08-25 MED ORDER — SODIUM CHLORIDE 0.9% FLUSH: 3.0000 mL | INTRAVENOUS | Status: DC | PRN

## 2018-08-25 MED ORDER — VERAPAMIL HCL 2.5 MG/ML IV SOLN
INTRAVENOUS | Status: AC
  Filled 2018-08-25: qty 2

## 2018-08-25 MED ORDER — SODIUM CHLORIDE 0.9% FLUSH: 3.0000 mL | Freq: Two times a day (BID) | INTRAVENOUS | Status: DC

## 2018-08-25 MED ORDER — HEPARIN SODIUM (PORCINE) 1000 UNIT/ML IJ SOLN
INTRAMUSCULAR | Status: DC | PRN
  Administered 2018-08-25 (×2): 5000 [IU] via INTRAVENOUS

## 2018-08-25 MED ORDER — SODIUM CHLORIDE 0.9 % WEIGHT BASED INFUSION: 1.0000 mL/kg/h | INTRAVENOUS | Status: DC

## 2018-08-25 MED ORDER — ASPIRIN 81 MG PO CHEW: 81.0000 mg | CHEWABLE_TABLET | ORAL | Status: DC

## 2018-08-25 MED ORDER — ACETAMINOPHEN 325 MG PO TABS: 650.0000 mg | ORAL_TABLET | ORAL | Status: DC | PRN

## 2018-08-25 MED ORDER — ONDANSETRON HCL 4 MG/2ML IJ SOLN: 4.0000 mg | Freq: Four times a day (QID) | INTRAMUSCULAR | Status: DC | PRN

## 2018-08-25 MED ORDER — LABETALOL HCL 5 MG/ML IV SOLN: 10.0000 mg | INTRAVENOUS | Status: DC | PRN

## 2018-08-25 MED ORDER — MIDAZOLAM HCL 2 MG/2ML IJ SOLN
INTRAMUSCULAR | Status: DC | PRN
  Administered 2018-08-25: 2 mg via INTRAVENOUS

## 2018-08-25 MED ORDER — VERAPAMIL HCL 2.5 MG/ML IV SOLN
INTRAVENOUS | Status: DC | PRN
  Administered 2018-08-25: 11:00:00 10 mL via INTRA_ARTERIAL

## 2018-08-25 MED ORDER — SODIUM CHLORIDE 0.9 % IV SOLN: INTRAVENOUS | Status: DC

## 2018-08-25 MED ORDER — DEXTROSE 50 % IV SOLN
INTRAVENOUS | Status: AC
  Administered 2018-08-25: 25 mL
  Filled 2018-08-25: qty 50

## 2018-08-25 MED ORDER — FENTANYL CITRATE (PF) 100 MCG/2ML IJ SOLN
INTRAMUSCULAR | Status: DC | PRN
  Administered 2018-08-25: 25 ug via INTRAVENOUS

## 2018-08-25 MED ORDER — SODIUM CHLORIDE 0.9 % WEIGHT BASED INFUSION
3.0000 mL/kg/h | INTRAVENOUS | Status: AC
  Administered 2018-08-25: 3 mL/kg/h via INTRAVENOUS

## 2018-08-25 MED ORDER — HYDRALAZINE HCL 20 MG/ML IJ SOLN: 10.0000 mg | INTRAMUSCULAR | Status: DC | PRN

## 2018-08-25 SURGICAL SUPPLY — 12 items
CATH 5FR JL3.5 JR4 ANG PIG MP (CATHETERS) ×1 IMPLANT
CATH LAUNCHER 6FR EBU3.5 (CATHETERS) ×1 IMPLANT
DEVICE RAD COMP TR BAND LRG (VASCULAR PRODUCTS) ×1 IMPLANT
GLIDESHEATH SLEND SS 6F .021 (SHEATH) ×1 IMPLANT
GUIDEWIRE INQWIRE 1.5J.035X260 (WIRE) IMPLANT
GUIDEWIRE PRESSURE COMET II (WIRE) ×1 IMPLANT
INQWIRE 1.5J .035X260CM (WIRE) ×2
KIT HEART LEFT (KITS) ×2 IMPLANT
PACK CARDIAC CATHETERIZATION (CUSTOM PROCEDURE TRAY) ×2 IMPLANT
SHEATH PROBE COVER 6X72 (BAG) ×1 IMPLANT
TRANSDUCER W/STOPCOCK (MISCELLANEOUS) ×2 IMPLANT
TUBING CIL FLEX 10 FLL-RA (TUBING) ×2 IMPLANT

## 2018-08-25 NOTE — Interval H&P Note (Signed)
History and Physical Interval Note:  08/25/2018 10:18 AM  Melanie Mathews  has presented today for surgery, with the diagnosis of cad w/ angina- abnormal coronary ct angiogram.  The various methods of treatment have been discussed with the patient and family. After consideration of risks, benefits and other options for treatment, the patient has consented to  Procedure(s): LEFT HEART CATH AND CORONARY ANGIOGRAPHY (N/A)  PERCUTANEOUS CORONARY INTERPRETATION  as a surgical intervention.  The patient's history has been reviewed, patient examined, no change in status, stable for surgery.  I have reviewed the patient's chart and labs.  Questions were answered to the patient's satisfaction.    Cath Lab Visit (complete for each Cath Lab visit)  Clinical Evaluation Leading to the Procedure:   ACS: No.  Non-ACS:    Anginal Classification: CCS III  Anti-ischemic medical therapy: Minimal Therapy (1 class of medications)  Non-Invasive Test Results: High-risk stress test findings: cardiac mortality >3%/year  Prior CABG: No previous CABG   Glenetta Hew

## 2018-08-25 NOTE — Progress Notes (Signed)
Hand dark blue/ purple on arrival to short stay. Small hematoma present above TRB. Pressure held for 5 minutes. Site level 0.

## 2018-08-25 NOTE — Progress Notes (Signed)
TRB BAND REMOVAL  LOCATION:    right radial  DEFLATED PER PROTOCOL:    Yes.    TIME BAND OFF / DRESSING APPLIED:    1430   SITE UPON ARRIVAL:    Level 0  SITE AFTER BAND REMOVAL:    Level 0  CIRCULATION SENSATION AND MOVEMENT:    Within Normal Limits   Yes.

## 2018-08-25 NOTE — Discharge Instructions (Signed)
DRINK PLENTY OF FLUIDS FOR THE NEXT 2-3 DAYS TO KEEP HYDRATED. HOLD METFORMIN FOR A FULL 48 HOURS AFTER DISCHARGE. KEEP AFFECTED ARM ELEVATED FOR THE REMAINDER OF THE DAY.  Radial Site Care  This sheet gives you information about how to care for yourself after your procedure. Your health care provider may also give you more specific instructions. If you have problems or questions, contact your health care provider. What can I expect after the procedure? After the procedure, it is common to have:  Bruising and tenderness at the catheter insertion area. Follow these instructions at home: Medicines  Take over-the-counter and prescription medicines only as told by your health care provider. Insertion site care  Follow instructions from your health care provider about how to take care of your insertion site. Make sure you: ? Wash your hands with soap and water before you change your bandage (dressing). If soap and water are not available, use hand sanitizer. ? Change your dressing as told by your health care provider.  Check your insertion site every day for signs of infection. Check for: ? Redness, swelling, or pain. ? Fluid or blood. ? Pus or a bad smell. ? Warmth.  Do not take baths, swim, or use a hot tub until your health care provider approves.  You may shower 24-48 hours after the procedure. ? Remove the dressing and gently wash the site with plain soap and water. ? Pat the area dry with a clean towel. ? Do not rub the site. That could cause bleeding.  Do not apply powder or lotion to the site. Activity   For 24 hours after the procedure, or as directed by your health care provider: ? Do not flex or bend the affected arm. ? Do not push or pull heavy objects with the affected arm. ? Do not drive yourself home from the hospital or clinic. You may drive 24 hours after the procedure. ? Do not operate machinery or power tools.  Do not lift anything that is heavier than 10  lb for 5 days.  Ask your health care provider when it is okay to: ? Return to work or school. ? Resume usual physical activities or sports. ? Resume sexual activity. General instructions  If the catheter site starts to bleed, raise your arm and put firm pressure on the site. If the bleeding does not stop, get help right away. This is a medical emergency.  If you went home on the same day as your procedure, a responsible adult should be with you for the first 24 hours after you arrive home.  Keep all follow-up visits as told by your health care provider. This is important. Contact a health care provider if:  You have a fever.  You have redness, swelling, or yellow drainage around your insertion site. Get help right away if:  You have unusual pain at the radial site.  The catheter insertion area swells very fast.  The insertion area is bleeding, and the bleeding does not stop when you hold steady pressure on the area.  Your arm or hand becomes pale, cool, tingly, or numb. These symptoms may represent a serious problem that is an emergency. Do not wait to see if the symptoms will go away. Get medical help right away. Call your local emergency services (911 in the [place] Do not drive yourself to the hospital. Summary  After the procedure, it is common to have bruising and tenderness at the site.  Follow instructions from your  health care provider about how to take care of your radial site wound. Check the wound every day for signs of infection.  Do not lift anything that is heavier than 10 lb for 5 days.  This information is not intended to replace advice given to you by your health care provider. Make sure you discuss any questions you have with your health care provider. Document Released: 02/08/2010 Document Revised: 02/11/2017 Document Reviewed: 02/11/2017 Elsevier Patient Education  2020 [employer].

## 2018-08-26 ENCOUNTER — Encounter (HOSPITAL_COMMUNITY): Payer: Self-pay | Admitting: Cardiology

## 2018-09-20 DIAGNOSIS — I10 Essential (primary) hypertension: Secondary | ICD-10-CM | POA: Diagnosis not present

## 2018-09-20 DIAGNOSIS — E1165 Type 2 diabetes mellitus with hyperglycemia: Secondary | ICD-10-CM | POA: Diagnosis not present

## 2018-10-08 ENCOUNTER — Other Ambulatory Visit: Payer: Self-pay

## 2018-10-08 ENCOUNTER — Encounter: Payer: Self-pay | Admitting: Cardiovascular Disease

## 2018-10-08 ENCOUNTER — Telehealth: Payer: Self-pay

## 2018-10-08 ENCOUNTER — Ambulatory Visit (INDEPENDENT_AMBULATORY_CARE_PROVIDER_SITE_OTHER): Payer: PPO | Admitting: Cardiovascular Disease

## 2018-10-08 VITALS — BP 106/72 | HR 72 | Ht 67.0 in | Wt 225.0 lb

## 2018-10-08 DIAGNOSIS — I10 Essential (primary) hypertension: Secondary | ICD-10-CM | POA: Diagnosis not present

## 2018-10-08 DIAGNOSIS — I2583 Coronary atherosclerosis due to lipid rich plaque: Secondary | ICD-10-CM | POA: Diagnosis not present

## 2018-10-08 DIAGNOSIS — E785 Hyperlipidemia, unspecified: Secondary | ICD-10-CM | POA: Diagnosis not present

## 2018-10-08 DIAGNOSIS — R079 Chest pain, unspecified: Secondary | ICD-10-CM | POA: Diagnosis not present

## 2018-10-08 DIAGNOSIS — E119 Type 2 diabetes mellitus without complications: Secondary | ICD-10-CM

## 2018-10-08 DIAGNOSIS — Z794 Long term (current) use of insulin: Secondary | ICD-10-CM

## 2018-10-08 DIAGNOSIS — IMO0001 Reserved for inherently not codable concepts without codable children: Secondary | ICD-10-CM

## 2018-10-08 DIAGNOSIS — I2510 Atherosclerotic heart disease of native coronary artery without angina pectoris: Secondary | ICD-10-CM

## 2018-10-08 MED ORDER — ISOSORBIDE MONONITRATE ER 30 MG PO TB24: 30.0000 mg | ORAL_TABLET | Freq: Every day | ORAL | 3 refills | Status: DC

## 2018-10-08 NOTE — Addendum Note (Signed)
Addended by: Barbarann Ehlers A on: 10/08/2018 11:53 AM   Modules accepted: Orders

## 2018-10-08 NOTE — Telephone Encounter (Signed)
Virtual Visit Pre-Appointment Phone Call  "(Name), I am calling you today to discuss your upcoming appointment. We are currently trying to limit exposure to the virus that causes COVID-19 by seeing patients at home rather than in the office."  1. "What is the BEST phone number to call the day of the visit?" - include this in appointment notes  2. "Do you have or have access to (through a family member/friend) a smartphone with video capability that we can use for your visit?" a. If yes - list this number in appt notes as "cell" (if different from BEST phone #) and list the appointment type as a VIDEO visit in appointment notes b. If no - list the appointment type as a PHONE visit in appointment notes  3. Confirm consent - "In the setting of the current Covid19 crisis, you are scheduled for a (phone or video) visit with your provider on (date) at (time).  Just as we do with many in-office visits, in order for you to participate in this visit, we must obtain consent.  If you'd like, I can send this to your mychart (if signed up) or email for you to review.  Otherwise, I can obtain your verbal consent now.  All virtual visits are billed to your insurance company just like a normal visit would be.  By agreeing to a virtual visit, we'd like you to understand that the technology does not allow for your provider to perform an examination, and thus may limit your provider's ability to fully assess your condition. If your provider identifies any concerns that need to be evaluated in person, we will make arrangements to do so.  Finally, though the technology is pretty good, we cannot assure that it will always work on either your or our end, and in the setting of a video visit, we may have to convert it to a phone-only visit.  In either situation, we cannot ensure that we have a secure connection.  Are you willing to proceed?" STAFF: Did the patient verbally acknowledge consent to telehealth visit? Document  YES/NO here: YES 4.   5. Advise patient to be prepared - "Two hours prior to your appointment, go ahead and check your blood pressure, pulse, oxygen saturation, and your weight (if you have the equipment to check those) and write them all down. When your visit starts, your provider will ask you for this information. If you have an Apple Watch or Kardia device, please plan to have heart rate information ready on the day of your appointment. Please have a pen and paper handy nearby the day of the visit as well."  6. Give patient instructions for MyChart download to smartphone OR Doximity/Doxy.me as below if video visit (depending on what platform provider is using)  7. Inform patient they will receive a phone call 15 minutes prior to their appointment time (may be from unknown caller ID) so they should be prepared to answer    TELEPHONE CALL NOTE  Melanie Mathews has been deemed a candidate for a follow-up tele-health visit to limit community exposure during the Covid-19 pandemic. I spoke with the patient via phone to ensure availability of phone/video source, confirm preferred email & phone number, and discuss instructions and expectations.  I reminded Melanie Mathews to be prepared with any vital sign and/or heart rhythm information that could potentially be obtained via home monitoring, at the time of her visit. I reminded Melanie Mathews to expect a phone call  prior to her visit.  Bernita Raisin, RN 10/08/2018 9:16 AM   INSTRUCTIONS FOR DOWNLOADING THE MYCHART APP TO SMARTPHONE  - The patient must first make sure to have activated MyChart and know their login information - If Apple, go to [employer] and type in MyChart in the search bar and download the app. If Android, ask patient to go to [employer] and type in [place] in the search bar and download the app. The app is free but as with any other app downloads, their phone may require them to verify saved payment information or  Apple/Android password.  - The patient will need to then log into the app with their MyChart username and password, and select [facility] as their healthcare provider to link the account. When it is time for your visit, go to the MyChart app, find appointments, and click Begin Video Visit. Be sure to Select Allow for your device to access the Microphone and Camera for your visit. You will then be connected, and your provider will be with you shortly.  **If they have any issues connecting, or need assistance please contact MyChart service desk [phone])**  **If using a computer, in order to ensure the best quality for their visit they will need to use either of the following Internet Browsers: [employer], or Google Chrome**  IF USING DOXIMITY or DOXY.ME - The patient will receive a link just prior to their visit by text.     FULL LENGTH CONSENT FOR TELE-HEALTH VISIT   I hereby voluntarily request, consent and authorize [facility] and its employed or contracted physicians, physician assistants, nurse practitioners or other licensed health care professionals (the Practitioner), to provide me with telemedicine health care services (the "Services") as deemed necessary by the treating Practitioner. I acknowledge and consent to receive the Services by the Practitioner via telemedicine. I understand that the telemedicine visit will involve communicating with the Practitioner through live audiovisual communication technology and the disclosure of certain medical information by electronic transmission. I acknowledge that I have been given the opportunity to request an in-person assessment or other available alternative prior to the telemedicine visit and am voluntarily participating in the telemedicine visit.  I understand that I have the right to withhold or withdraw my consent to the use of telemedicine in the course of my care at any time, without affecting my right to future care  or treatment, and that the Practitioner or I may terminate the telemedicine visit at any time. I understand that I have the right to inspect all information obtained and/or recorded in the course of the telemedicine visit and may receive copies of available information for a reasonable fee.  I understand that some of the potential risks of receiving the Services via telemedicine include:  Marland Kitchen Delay or interruption in medical evaluation due to technological equipment failure or disruption; . Information transmitted may not be sufficient (e.g. poor resolution of images) to allow for appropriate medical decision making by the Practitioner; and/or  . In rare instances, security protocols could fail, causing a breach of personal health information.  Furthermore, I acknowledge that it is my responsibility to provide information about my medical history, conditions and care that is complete and accurate to the best of my ability. I acknowledge that Practitioner's advice, recommendations, and/or decision may be based on factors not within their control, such as incomplete or inaccurate data provided by me or distortions of diagnostic images or specimens that may result from  electronic transmissions. I understand that the practice of medicine is not an exact science and that Practitioner makes no warranties or guarantees regarding treatment outcomes. I acknowledge that I will receive a copy of this consent concurrently upon execution via email to the email address I last provided but may also request a printed copy by calling the office of [facility].    I understand that my insurance will be billed for this visit.   I have read or had this consent read to me. . I understand the contents of this consent, which adequately explains the benefits and risks of the Services being provided via telemedicine.  . I have been provided ample opportunity to ask questions regarding this consent and the Services and have had  my questions answered to my satisfaction. . I give my informed consent for the services to be provided through the use of telemedicine in my medical care  By participating in this telemedicine visit I agree to the above.

## 2018-10-08 NOTE — Patient Instructions (Signed)
Medication Instructions: STOP Aspirin   START Imdur 30 mg daily  Labwork: None  Procedures/Testing: None  Follow-Up: 3 months telephone visit, or office visit with Dr.koneswaran  Any Additional Special Instructions Will Be Listed Below (If Applicable).     If you need a refill on your cardiac medications before your next appointment, please call your pharmacy.     Thank you for choosing [facility] !

## 2018-10-08 NOTE — Progress Notes (Signed)
Virtual Visit via Telephone Note   This visit type was conducted due to national recommendations for restrictions regarding the COVID-19 Pandemic (e.g. social distancing) in an effort to limit this patient's exposure and mitigate transmission in our community.  Due to her co-morbid illnesses, this patient is at least at moderate risk for complications without adequate follow up.  This format is felt to be most appropriate for this patient at this time.  The patient did not have access to video technology/had technical difficulties with video requiring transitioning to audio format only (telephone).  All issues noted in this document were discussed and addressed.  No physical exam could be performed with this format.  Please refer to the patient's chart for her  consent to telehealth for [facility].   Date:  10/08/2018   ID:  Melanie Mathews, DOB 1945-05-17, MRN [id number]  Patient Location: Home Provider Location: Office  PCP:  Selinda Flavin, MD  Cardiologist:  Prentice Docker, MD  Electrophysiologist:  None   Evaluation Performed:  Follow-Up Visit  Chief Complaint: Follow-up cardiac visit  History of Present Illness:    Melanie Mathews is a 73 y.o. female with a history of chest pain underwent cardiac catheterization on 08/25/2018.  She had nonobstructive coronary disease in the LAD.  Full details are reviewed below.  She continues to have episodic left-sided chest pains with both rest and exertion.  The patient does not have symptoms concerning for COVID-19 infection (fever, chills, cough, or new shortness of breath).   Social history: Her husband, Melanie Mathews, passed away in 03-11-17. He had also been my patient. They had been married for 52 years. Her daughter died of a brain infection in May 10, 2018. She was 81 years old. She has a son.  Past Medical History:  Diagnosis Date  . Anxiety   . Diabetes mellitus   . Hernia   . Hypertension   . Hypothyroidism    Past  Surgical History:  Procedure Laterality Date  . ABDOMINAL HYSTERECTOMY    . CERVICAL FUSION    . CHOLECYSTECTOMY    . COLONOSCOPY    . COLONOSCOPY  07/03/2011   Procedure: COLONOSCOPY;  Surgeon: Malissa Hippo, MD;  [facility];  Service: Endoscopy;  Laterality: N/A;  1030  . ESOPHAGEAL DILATION N/A 04/19/2015   Procedure: ESOPHAGEAL DILATION;  Surgeon: Malissa Hippo, MD;  [facility];  Service: Endoscopy;  Laterality: N/A;  . ESOPHAGOGASTRODUODENOSCOPY N/A 04/19/2015   Procedure: ESOPHAGOGASTRODUODENOSCOPY (EGD);  Surgeon: Malissa Hippo, MD;  [facility];  Service: Endoscopy;  Laterality: N/A;  2:05  . KNEE SURGERY     Arthroscopic  . LEFT HEART CATH AND CORONARY ANGIOGRAPHY N/A 08/25/2018   Procedure: LEFT HEART CATH AND CORONARY ANGIOGRAPHY;  Surgeon: Marykay Lex, MD;  [facility]: [facility] CV [facility];  Service: Cardiovascular;  Laterality: N/A;  . UPPER GASTROINTESTINAL ENDOSCOPY       Current Meds  Medication Sig  . acetaminophen (TYLENOL) 650 MG CR tablet Take 1,300 mg by mouth every 8 (eight) hours as needed for pain.  Marland Kitchen allopurinol (ZYLOPRIM) 300 MG tablet Take 300 mg by mouth daily.   Marland Kitchen aspirin EC 81 MG tablet Take 81 mg by mouth daily.  . Calcium Carb-Cholecalciferol (CALCIUM-VITAMIN D3) 500-400 MG-UNIT TABS Take 1 tablet by mouth daily.  . DULoxetine (CYMBALTA) 60 MG capsule Take 60 mg by mouth 2 (two) times daily.   . furosemide (LASIX) 40 MG tablet Take 40  mg by mouth daily.   Marland Kitchen gabapentin (NEURONTIN) 800 MG tablet Take 1,600 mg by mouth 2 (two) times daily.   . insulin glargine (LANTUS) 100 UNIT/ML injection Inject 12-40 Units into the skin See admin instructions. Inject 40 units in the morning and 12 units at night  . ketoconazole (NIZORAL) 2 % cream Apply 1 application topically daily as needed (foot fungus).  Marland Kitchen levothyroxine (SYNTHROID) 125 MCG tablet Take 125 mcg by mouth daily.   . metFORMIN (GLUCOPHAGE) 500 MG tablet Take 1  tablet by mouth 2 (two) times a day.  . metoprolol tartrate (LOPRESSOR) 50 MG tablet Take 50 mg by mouth 2 (two) times a day.   Marland Kitchen NEXIUM 40 MG capsule TAKE ONE CAPSULE ONCE DAILY (Patient taking differently: Take 40 mg by mouth daily. )  . Probiotic Product (PROBIOTIC-10 PO) Take 1 tablet by mouth daily.  . simvastatin (ZOCOR) 40 MG tablet Take 40 mg by mouth at bedtime.   . traMADol (ULTRAM) 50 MG tablet Take 50 mg by mouth See admin instructions. Take 50 mg by mouth twice daily, may take a third 50 mg dose mid day as needed for pain  . Turmeric 500 MG CAPS Take 500 mg by mouth 2 (two) times daily.   Current Facility-Administered Medications for the 10/08/18 encounter (Office Visit) with Herminio Commons, MD  Medication  . sodium chloride flush (NS) 0.9 % injection 3 mL     Allergies:   Sulfur and Adhesive [tape]   Social History   Tobacco Use  . Smoking status: Former Smoker    Packs/day: 0.50    Years: 3.00    Pack years: 1.50    Types: Cigarettes    Quit date: 06/09/1991    Years since quitting: 27.3  . Smokeless tobacco: Never Used  . Tobacco comment: Patient states that she smoked rare  Substance Use Topics  . Alcohol use: No  . Drug use: No     Family Hx: The patient's family history includes Arthritis in her mother and sister; COPD in her sister; Diabetes in her sister; Pancreatic cancer in her father.  ROS:   Please see the history of present illness.     All other systems reviewed and are negative.   Prior CV studies:   The following studies were reviewed today:  Cardiac catheterization 08/25/2018:   Prox LAD lesion is 40% stenosed. = DFR 0.91 with an FFR 0.85.  Mid LAD lesion is 30% stenosed. Dist LAD lesion is 50% stenosed.  Otherwise angiographically normal coronary arteries with a left dominant system.  Normal LVEDP   SUMMARY  Angiographically minimal very proximal LAD lesion that surprisingly was borderline by DFR and moderate stenosis by FFR  (0.85 --it is possible that FFR is considered from the distal vessel that this would have been an positive if measured at the apex) --> not physiologically significant by FFR therefore no PCI  Otherwise minimal CAD with left dominant system.  Normal LVEDP   RECOMMENDATIONS  Discharge home after bed rest per PCI protocol based on extra heparin given for FFR  Follow-up with Dr. Bronson Ing.  Recommend aggressive risk factor modification and consider treatment with antispasm medications as this lesion would be prone to spasm  Echocardiogram 08/19/2018:  1. The left ventricle has normal systolic function with an ejection fraction of 60-65%. The cavity size was normal. Left ventricular diastolic Doppler parameters are consistent with impaired relaxation. Indeterminate filling pressures. 2. The right ventricle has normal systolic function. The cavity  was normal. There is no increase in right ventricular wall thickness. 3. The mitral valve is grossly normal. 4. The tricuspid valve is grossly normal. 5. The aortic valve was not well visualized. Aortic valve regurgitation is mild by color flow Doppler. 6. The aorta is normal in size and structure. 7. The interatrial septum was not assessed. 8. Technically difficult study due to body habitus.  Labs/Other Tests and Data Reviewed:    EKG:  No ECG reviewed.  Recent Labs: 08/18/2018: BUN 20; Creat 1.15; Potassium 4.0; Sodium 138 08/23/2018: Hemoglobin 12.4; Platelets 242   Recent Lipid Panel No results found for: CHOL, TRIG, HDL, CHOLHDL, LDLCALC, LDLDIRECT  Wt Readings from Last 3 Encounters:  10/08/18 225 lb (102.1 kg)  08/25/18 225 lb (102.1 kg)  07/29/18 228 lb 12.8 oz (103.8 kg)     Objective:    Vital Signs:  BP 106/72   Pulse 72   Ht 5\' 7"  (1.702 m)   Wt 225 lb (102.1 kg)   BMI 35.24 kg/m    VITAL SIGNS:  reviewed  ASSESSMENT & PLAN:    1.  Coronary artery disease: This was mild and nonobstructive.  She  continues to have chest pain.  Cardiac catheterization results did mention the possibility of vasospasm at the site of the lesion.  She is on metoprolol and simvastatin.  I will stop aspirin.  I will start Imdur 30 mg daily.  2.  Hypertension: Blood pressure is normal.  I will monitor given addition of Imdur.  3.  Insulin-dependent diabetes mellitus: On insulin and statin.  Most recent A1c 6.4%.  4.  Hypercholesterolemia: LDL 83 on 06/21/2018.  Continue simvastatin.   COVID-19 Education: The signs and symptoms of COVID-19 were discussed with the patient and how to seek care for testing (follow up with PCP or arrange E-visit).  The importance of social distancing was discussed today.  Time:   Today, I have spent 10 minutes with the patient with telehealth technology discussing the above problems.     Medication Adjustments/Labs and Tests Ordered: Current medicines are reviewed at length with the patient today.  Concerns regarding medicines are outlined above.   Tests Ordered: No orders of the defined types were placed in this encounter.   Medication Changes: No orders of the defined types were placed in this encounter.   Follow Up:  Virtual Visit or In Person in 3 month(s)  Signed, Prentice DockerSuresh Marea Reasner, MD  10/08/2018 9:24 AM    [facility]

## 2018-10-15 DIAGNOSIS — R5383 Other fatigue: Secondary | ICD-10-CM | POA: Diagnosis not present

## 2018-10-15 DIAGNOSIS — E1165 Type 2 diabetes mellitus with hyperglycemia: Secondary | ICD-10-CM | POA: Diagnosis not present

## 2018-10-15 DIAGNOSIS — K219 Gastro-esophageal reflux disease without esophagitis: Secondary | ICD-10-CM | POA: Diagnosis not present

## 2018-10-15 DIAGNOSIS — E039 Hypothyroidism, unspecified: Secondary | ICD-10-CM | POA: Diagnosis not present

## 2018-10-15 DIAGNOSIS — I10 Essential (primary) hypertension: Secondary | ICD-10-CM | POA: Diagnosis not present

## 2018-10-18 DIAGNOSIS — M79671 Pain in right foot: Secondary | ICD-10-CM | POA: Diagnosis not present

## 2018-10-18 DIAGNOSIS — I739 Peripheral vascular disease, unspecified: Secondary | ICD-10-CM | POA: Diagnosis not present

## 2018-10-18 DIAGNOSIS — L110 Acquired keratosis follicularis: Secondary | ICD-10-CM | POA: Diagnosis not present

## 2018-10-18 DIAGNOSIS — E1140 Type 2 diabetes mellitus with diabetic neuropathy, unspecified: Secondary | ICD-10-CM | POA: Diagnosis not present

## 2018-10-18 DIAGNOSIS — M79672 Pain in left foot: Secondary | ICD-10-CM | POA: Diagnosis not present

## 2018-10-20 DIAGNOSIS — E1165 Type 2 diabetes mellitus with hyperglycemia: Secondary | ICD-10-CM | POA: Diagnosis not present

## 2018-10-20 DIAGNOSIS — M109 Gout, unspecified: Secondary | ICD-10-CM | POA: Diagnosis not present

## 2018-10-20 DIAGNOSIS — Z6836 Body mass index (BMI) 36.0-36.9, adult: Secondary | ICD-10-CM | POA: Diagnosis not present

## 2018-10-20 DIAGNOSIS — I10 Essential (primary) hypertension: Secondary | ICD-10-CM | POA: Diagnosis not present

## 2018-10-20 DIAGNOSIS — Z23 Encounter for immunization: Secondary | ICD-10-CM | POA: Diagnosis not present

## 2018-10-20 DIAGNOSIS — K219 Gastro-esophageal reflux disease without esophagitis: Secondary | ICD-10-CM | POA: Diagnosis not present

## 2018-10-20 DIAGNOSIS — R609 Edema, unspecified: Secondary | ICD-10-CM | POA: Diagnosis not present

## 2018-11-01 ENCOUNTER — Ambulatory Visit: Payer: PPO | Admitting: Cardiovascular Disease

## 2018-11-19 DIAGNOSIS — K219 Gastro-esophageal reflux disease without esophagitis: Secondary | ICD-10-CM | POA: Diagnosis not present

## 2018-11-19 DIAGNOSIS — E1165 Type 2 diabetes mellitus with hyperglycemia: Secondary | ICD-10-CM | POA: Diagnosis not present

## 2018-12-01 DIAGNOSIS — M7989 Other specified soft tissue disorders: Secondary | ICD-10-CM | POA: Diagnosis not present

## 2018-12-01 DIAGNOSIS — M109 Gout, unspecified: Secondary | ICD-10-CM | POA: Diagnosis not present

## 2018-12-01 DIAGNOSIS — Z6837 Body mass index (BMI) 37.0-37.9, adult: Secondary | ICD-10-CM | POA: Diagnosis not present

## 2018-12-01 DIAGNOSIS — E1340 Other specified diabetes mellitus with diabetic neuropathy, unspecified: Secondary | ICD-10-CM | POA: Diagnosis not present

## 2018-12-08 DIAGNOSIS — Z6836 Body mass index (BMI) 36.0-36.9, adult: Secondary | ICD-10-CM | POA: Diagnosis not present

## 2018-12-08 DIAGNOSIS — M170 Bilateral primary osteoarthritis of knee: Secondary | ICD-10-CM | POA: Diagnosis not present

## 2018-12-08 DIAGNOSIS — M25562 Pain in left knee: Secondary | ICD-10-CM | POA: Diagnosis not present

## 2018-12-08 DIAGNOSIS — M25561 Pain in right knee: Secondary | ICD-10-CM | POA: Diagnosis not present

## 2018-12-14 DIAGNOSIS — S90425D Blister (nonthermal), left lesser toe(s), subsequent encounter: Secondary | ICD-10-CM | POA: Diagnosis not present

## 2018-12-14 DIAGNOSIS — M170 Bilateral primary osteoarthritis of knee: Secondary | ICD-10-CM | POA: Diagnosis not present

## 2018-12-20 ENCOUNTER — Ambulatory Visit: Payer: PPO | Admitting: Orthopedic Surgery

## 2018-12-20 DIAGNOSIS — H52223 Regular astigmatism, bilateral: Secondary | ICD-10-CM | POA: Diagnosis not present

## 2018-12-20 DIAGNOSIS — E119 Type 2 diabetes mellitus without complications: Secondary | ICD-10-CM | POA: Diagnosis not present

## 2018-12-20 DIAGNOSIS — H3589 Other specified retinal disorders: Secondary | ICD-10-CM | POA: Diagnosis not present

## 2018-12-20 DIAGNOSIS — H43813 Vitreous degeneration, bilateral: Secondary | ICD-10-CM | POA: Diagnosis not present

## 2018-12-20 DIAGNOSIS — I10 Essential (primary) hypertension: Secondary | ICD-10-CM | POA: Diagnosis not present

## 2018-12-20 DIAGNOSIS — H524 Presbyopia: Secondary | ICD-10-CM | POA: Diagnosis not present

## 2018-12-20 DIAGNOSIS — H5212 Myopia, left eye: Secondary | ICD-10-CM | POA: Diagnosis not present

## 2018-12-20 DIAGNOSIS — E782 Mixed hyperlipidemia: Secondary | ICD-10-CM | POA: Diagnosis not present

## 2018-12-20 DIAGNOSIS — Z961 Presence of intraocular lens: Secondary | ICD-10-CM | POA: Diagnosis not present

## 2018-12-20 DIAGNOSIS — Z9849 Cataract extraction status, unspecified eye: Secondary | ICD-10-CM | POA: Diagnosis not present

## 2018-12-21 ENCOUNTER — Ambulatory Visit (INDEPENDENT_AMBULATORY_CARE_PROVIDER_SITE_OTHER): Payer: PPO | Admitting: Orthopedic Surgery

## 2018-12-21 ENCOUNTER — Encounter: Payer: Self-pay | Admitting: Orthopedic Surgery

## 2018-12-21 ENCOUNTER — Other Ambulatory Visit: Payer: Self-pay

## 2018-12-21 ENCOUNTER — Ambulatory Visit (INDEPENDENT_AMBULATORY_CARE_PROVIDER_SITE_OTHER): Payer: PPO

## 2018-12-21 VITALS — Ht 67.0 in | Wt 225.0 lb

## 2018-12-21 DIAGNOSIS — M79672 Pain in left foot: Secondary | ICD-10-CM

## 2018-12-21 DIAGNOSIS — M1A09X0 Idiopathic chronic gout, multiple sites, without tophus (tophi): Secondary | ICD-10-CM | POA: Diagnosis not present

## 2018-12-21 MED ORDER — COLCHICINE 0.6 MG PO CAPS: 0.6000 mg | ORAL_CAPSULE | Freq: Two times a day (BID) | ORAL | 1 refills | Status: DC | PRN

## 2018-12-22 ENCOUNTER — Telehealth: Payer: Self-pay

## 2018-12-22 ENCOUNTER — Other Ambulatory Visit: Payer: Self-pay

## 2018-12-22 MED ORDER — COLCHICINE 0.6 MG PO TABS: 0.6000 mg | ORAL_TABLET | Freq: Two times a day (BID) | ORAL | 3 refills | Status: DC

## 2018-12-22 NOTE — Telephone Encounter (Signed)
Courtney with [facility] called stating that Rx for Colchicine 0.6mg  needs to be changed from capsules to tablets.  Stated that the tablets are a better price for patient.  Cb# is [phone].  Please advise.  Thank you.

## 2018-12-22 NOTE — Telephone Encounter (Signed)
This has been changed and sent to pharm.

## 2018-12-24 ENCOUNTER — Encounter: Payer: Self-pay | Admitting: Orthopedic Surgery

## 2018-12-24 NOTE — Progress Notes (Signed)
Office Visit Note   Patient: Melanie Mathews           Date of Birth: May 24, 1945           MRN: [id number] Visit Date: 12/21/2018              Requested by: Rory Percy, MD [place],  [place] [address] PCP: Rory Percy, MD  Chief Complaint  Patient presents with  . Left Foot - New Patient (Initial Visit), Pain      HPI: Patient is a 73 year old woman who was seen for initial evaluation for swelling and redness left foot second toe.  Patient states she has had a steroid injection recently.  She states the symptoms of been there for 2 to 3 months she denies any injuries.  Patient does have a history of gout and a history of diabetes.  Assessment & Plan: Visit Diagnoses:  1. Left foot pain   2. 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  Reevaluate in 4 weeks obtain uric acid at follow-up.  Follow-Up Instructions: Return in about 4 weeks (around 01/18/2019).   Ortho Exam  Patient is alert, oriented, no adenopathy, well-dressed, normal affect, normal respiratory effort. Examination patient has a strong dorsalis pedis pulse the skin wrinkles well there are no ulcers there is no cellulitis there is no tenderness to palpation there is some swelling of the second toe with redness.  MTP joint is minimally tender to palpation no redness or cellulitis.  Patient states that her last hemoglobin A1c was 6.5.  Imaging: No results found. No images are attached to the encounter.  Labs: No results found for: HGBA1C, ESRSEDRATE, CRP, LABURIC, REPTSTATUS, GRAMSTAIN, CULT, LABORGA   No results found for: ALBUMIN, PREALBUMIN, LABURIC  No results found for: MG No results found for: VD25OH  No results found for: PREALBUMIN CBC EXTENDED Latest Ref Rng & Units 08/23/2018  WBC 4.0 - 10.5 K/uL 6.6  RBC  3.87 - 5.11 MIL/uL 4.39  HGB 12.0 - 15.0 g/dL 12.4  HCT 36.0 - 46.0 % 39.7  PLT 150 - 400 K/uL 242  NEUTROABS 1.7 - 7.7 K/uL 4.2  LYMPHSABS 0.7 - 4.0 K/uL 1.8     Body mass index is 35.24 kg/m.  Orders:  Orders Placed This Encounter  Procedures  . XR Foot 2 Views Left  . XR Toe 2nd Left   Meds ordered this encounter  Medications  . DISCONTD: Colchicine 0.6 MG CAPS    Sig: Take 0.6 mg by mouth 2 (two) times daily as needed.    Dispense:  60 capsule    Refill:  1     Procedures: No procedures performed  Clinical Data: No additional findings.  ROS:  All other systems negative, except as noted in the HPI. Review of Systems  Objective: Vital Signs: Ht 5\' 7"  (1.702 m)   Wt 225 lb (102.1 kg)   BMI 35.24 kg/m   Specialty Comments:  No specialty comments available.  PMFS History: Patient Active Problem List   Diagnosis Date Noted  . Progressive angina ([facility]) 08/25/2018  . Abnormal cardiac CT angiography 08/25/2018  . Coronary artery disease   . Anal fissure 06/09/2011  . Hematochezia 06/09/2011  . GERD (gastroesophageal reflux disease) 06/09/2011  . Dysphagia 06/09/2011  .  Diabetes mellitus, type II, insulin dependent (HCC) 06/09/2011  . Essential hypertension 06/09/2011  . Hyperlipidemia associated with type 2 diabetes mellitus (HCC) 06/09/2011   Past Medical History:  Diagnosis Date  . Anxiety   . Diabetes mellitus   . Hernia   . Hypertension   . Hypothyroidism     Family History  Problem Relation Age of Onset  . Arthritis Mother   . Pancreatic cancer Father   . Arthritis Sister   . COPD Sister   . Diabetes Sister     Past Surgical History:  Procedure Laterality Date  . ABDOMINAL HYSTERECTOMY    . CERVICAL FUSION    . CHOLECYSTECTOMY    . COLONOSCOPY    . COLONOSCOPY  07/03/2011   Procedure: COLONOSCOPY;  Surgeon: Malissa Hippo, MD;  [facility];  Service: Endoscopy;  Laterality: N/A;  1030  . ESOPHAGEAL DILATION N/A 04/19/2015    Procedure: ESOPHAGEAL DILATION;  Surgeon: Malissa Hippo, MD;  [facility];  Service: Endoscopy;  Laterality: N/A;  . ESOPHAGOGASTRODUODENOSCOPY N/A 04/19/2015   Procedure: ESOPHAGOGASTRODUODENOSCOPY (EGD);  Surgeon: Malissa Hippo, MD;  [facility];  Service: Endoscopy;  Laterality: N/A;  2:05  . KNEE SURGERY     Arthroscopic  . LEFT HEART CATH AND CORONARY ANGIOGRAPHY N/A 08/25/2018   Procedure: LEFT HEART CATH AND CORONARY ANGIOGRAPHY;  Surgeon: Marykay Lex, MD;  Location: [facility] INVASIVE CV LAB;  Service: Cardiovascular;  Laterality: N/A;  . UPPER GASTROINTESTINAL ENDOSCOPY     Social History   Occupational History  . Not on file  Tobacco Use  . Smoking status: Former Smoker    Packs/day: 0.50    Years: 3.00    Pack years: 1.50    Types: Cigarettes    Quit date: 06/09/1991    Years since quitting: 27.5  . Smokeless tobacco: Never Used  . Tobacco comment: Patient states that she smoked rare  Substance and Sexual Activity  . Alcohol use: No  . Drug use: No  . Sexual activity: Not on file

## 2019-01-03 DIAGNOSIS — L110 Acquired keratosis follicularis: Secondary | ICD-10-CM | POA: Diagnosis not present

## 2019-01-03 DIAGNOSIS — E1140 Type 2 diabetes mellitus with diabetic neuropathy, unspecified: Secondary | ICD-10-CM | POA: Diagnosis not present

## 2019-01-03 DIAGNOSIS — M79672 Pain in left foot: Secondary | ICD-10-CM | POA: Diagnosis not present

## 2019-01-03 DIAGNOSIS — I739 Peripheral vascular disease, unspecified: Secondary | ICD-10-CM | POA: Diagnosis not present

## 2019-01-03 DIAGNOSIS — M79671 Pain in right foot: Secondary | ICD-10-CM | POA: Diagnosis not present

## 2019-01-07 ENCOUNTER — Ambulatory Visit: Payer: PPO | Admitting: Cardiovascular Disease

## 2019-01-20 DIAGNOSIS — E1165 Type 2 diabetes mellitus with hyperglycemia: Secondary | ICD-10-CM | POA: Diagnosis not present

## 2019-01-20 DIAGNOSIS — I10 Essential (primary) hypertension: Secondary | ICD-10-CM | POA: Diagnosis not present

## 2019-01-31 ENCOUNTER — Telehealth (INDEPENDENT_AMBULATORY_CARE_PROVIDER_SITE_OTHER): Payer: PPO | Admitting: Cardiovascular Disease

## 2019-01-31 ENCOUNTER — Encounter: Payer: Self-pay | Admitting: Cardiovascular Disease

## 2019-01-31 VITALS — BP 156/96 | HR 63 | Ht 67.5 in | Wt 225.0 lb

## 2019-01-31 DIAGNOSIS — I2510 Atherosclerotic heart disease of native coronary artery without angina pectoris: Secondary | ICD-10-CM

## 2019-01-31 DIAGNOSIS — I2583 Coronary atherosclerosis due to lipid rich plaque: Secondary | ICD-10-CM

## 2019-01-31 DIAGNOSIS — I10 Essential (primary) hypertension: Secondary | ICD-10-CM

## 2019-01-31 DIAGNOSIS — E785 Hyperlipidemia, unspecified: Secondary | ICD-10-CM

## 2019-01-31 DIAGNOSIS — R079 Chest pain, unspecified: Secondary | ICD-10-CM | POA: Diagnosis not present

## 2019-01-31 DIAGNOSIS — E119 Type 2 diabetes mellitus without complications: Secondary | ICD-10-CM

## 2019-01-31 NOTE — Patient Instructions (Signed)
Your physician wants you to follow-up in: 6 MONTHS WITH DR KONESWAR[facility] You will receive a reminder letter in the mail two months in advance. If you don't receive a letter, please call our office to schedule the follow-up appointment.  Your physician recommends that you continue on your current medications as directed. Please refer to the Current Medication list given to you today.  Thank you for choosing [facility]!!    

## 2019-01-31 NOTE — Progress Notes (Signed)
Virtual Visit via Telephone Note   This visit type was conducted due to national recommendations for restrictions regarding the COVID-19 Pandemic (e.g. social distancing) in an effort to limit this patient's exposure and mitigate transmission in our community.  Due to her co-morbid illnesses, this patient is at least at moderate risk for complications without adequate follow up.  This format is felt to be most appropriate for this patient at this time.  The patient did not have access to video technology/had technical difficulties with video requiring transitioning to audio format only (telephone).  All issues noted in this document were discussed and addressed.  No physical exam could be performed with this format.  Please refer to the patient's chart for her  consent to telehealth for [facility].   Date:  01/31/2019   ID:  Melanie Mathews, DOB [birth year], MRN [id number]  Patient Location: Home Provider Location: Office  PCP:  Selinda Flavin, MD  Cardiologist:  Prentice Docker, MD  Electrophysiologist:  None   Evaluation Performed:  Follow-Up Visit  Chief Complaint:  Chest pain  History of Present Illness:    Melanie Mathews Key is a 74 y.o. female with a history of chest pain and underwent cardiac catheterization on 08/25/2018.  She had nonobstructive coronary disease in the LAD.  Full details are reviewed below.  She seldom has chest pains and they are less frequent and mild. She's not sure why her BP is elevated this morning.  She is using new socks for bilateral feet neuropathy and symptoms have improved.  She said she needs to be more active. The holidays have been hard for her.    Social history: Her husband, Melanie Mathews, passed away in 02-08-17. He had also been my patient. They had been married for 52 years. Her daughter died of a brain infection in 04/09/2018. She was 58 years old. She has a son.   Past Medical History:  Diagnosis Date  . Anxiety   . Diabetes  mellitus   . Hernia   . Hypertension   . Hypothyroidism    Past Surgical History:  Procedure Laterality Date  . ABDOMINAL HYSTERECTOMY    . CERVICAL FUSION    . CHOLECYSTECTOMY    . COLONOSCOPY    . COLONOSCOPY  07/03/2011   Procedure: COLONOSCOPY;  Surgeon: Malissa Hippo, MD;  [facility];  Service: Endoscopy;  Laterality: N/A;  1030  . ESOPHAGEAL DILATION N/A 04/19/2015   Procedure: ESOPHAGEAL DILATION;  Surgeon: Malissa Hippo, MD;  [facility];  Service: Endoscopy;  Laterality: N/A;  . ESOPHAGOGASTRODUODENOSCOPY N/A 04/19/2015   Procedure: ESOPHAGOGASTRODUODENOSCOPY (EGD);  Surgeon: Malissa Hippo, MD;  [facility];  Service: Endoscopy;  Laterality: N/A;  2:05  . KNEE SURGERY     Arthroscopic  . LEFT HEART CATH AND CORONARY ANGIOGRAPHY N/A 08/25/2018   Procedure: LEFT HEART CATH AND CORONARY ANGIOGRAPHY;  Surgeon: Marykay Lex, MD;  Location: [facility] INVASIVE CV LAB;  Service: Cardiovascular;  Laterality: N/A;  . UPPER GASTROINTESTINAL ENDOSCOPY       Current Meds  Medication Sig  . acetaminophen (TYLENOL) 650 MG CR tablet Take 1,300 mg by mouth every 8 (eight) hours as needed for pain.  Marland Kitchen allopurinol (ZYLOPRIM) 300 MG tablet Take 300 mg by mouth daily.   . Calcium Carb-Cholecalciferol (CALCIUM-VITAMIN D3) 500-400 MG-UNIT TABS Take 1 tablet by mouth daily.  . colchicine 0.6 MG tablet Take 1 tablet (0.6 mg total) by mouth 2 (two)  times daily.  . DULoxetine (CYMBALTA) 60 MG capsule Take 60 mg by mouth 2 (two) times daily.   . furosemide (LASIX) 40 MG tablet Take 40 mg by mouth daily.   Marland Kitchen gabapentin (NEURONTIN) 800 MG tablet Take 1,600 mg by mouth 2 (two) times daily.   . insulin glargine (LANTUS) 100 UNIT/ML injection Inject 12-40 Units into the skin See admin instructions. Inject 40 units in the morning and 12 units at night  . isosorbide mononitrate (IMDUR) 30 MG 24 hr tablet Take 1 tablet (30 mg total) by mouth daily.  Marland Kitchen ketoconazole (NIZORAL)  2 % cream Apply 1 application topically daily as needed (foot fungus).  Marland Kitchen levothyroxine (SYNTHROID) 125 MCG tablet Take 125 mcg by mouth daily.   . metFORMIN (GLUCOPHAGE) 500 MG tablet Take 1 tablet by mouth 2 (two) times a day.  . metoprolol tartrate (LOPRESSOR) 50 MG tablet Take 50 mg by mouth 2 (two) times a day.   Marland Kitchen NEXIUM 40 MG capsule TAKE ONE CAPSULE ONCE DAILY (Patient taking differently: Take 40 mg by mouth daily. )  . Probiotic Product (PROBIOTIC-10 PO) Take 1 tablet by mouth daily.  . simvastatin (ZOCOR) 40 MG tablet Take 40 mg by mouth at bedtime.   . traMADol (ULTRAM) 50 MG tablet Take 50 mg by mouth See admin instructions. Take 50 mg by mouth twice daily, may take a third 50 mg dose mid day as needed for pain  . Turmeric 500 MG CAPS Take 500 mg by mouth 2 (two) times daily.  . Zinc Sulfate (ZINC 15 PO) Take by mouth.   Current Facility-Administered Medications for the 01/31/19 encounter (Telemedicine) with Laqueta Linden, MD  Medication  . sodium chloride flush (NS) 0.9 % injection 3 mL     Allergies:   Sulfur and Adhesive [tape]   Social History   Tobacco Use  . Smoking status: Former Smoker    Packs/day: 0.50    Years: 3.00    Pack years: 1.50    Types: Cigarettes    Quit date: 06/09/1991    Years since quitting: 27.6  . Smokeless tobacco: Never Used  . Tobacco comment: Patient states that she smoked rare  Substance Use Topics  . Alcohol use: No  . Drug use: No     Family Hx: The patient's family history includes Arthritis in her mother and sister; COPD in her sister; Diabetes in her sister; Pancreatic cancer in her father.  ROS:   Please see the history of present illness.     All other systems reviewed and are negative.   Prior CV studies:   The following studies were reviewed today:  Cardiac catheterization 08/25/2018:   Prox LAD lesion is 40% stenosed. = DFR 0.91 with an FFR 0.85.  Mid LAD lesion is 30% stenosed. Dist LAD lesion is 50%  stenosed.  Otherwise angiographically normal coronary arteries with a left dominant system.  Normal LVEDP  SUMMARY  Angiographically minimal very proximal LAD lesion that surprisingly was borderline by DFR and moderate stenosis by FFR (0.85 --it is possible that FFR is considered from the distal vessel that this would have been an positive if measured at the apex) --> not physiologically significant by FFR therefore no PCI  Otherwise minimal CAD with left dominant system.  Normal LVEDP   RECOMMENDATIONS  Discharge home after bed rest per PCI protocol based on extra heparin given for FFR  Follow-up with Dr. Purvis Sheffield.  Recommend aggressive risk factor modification and consider treatment with antispasm  medications as this lesion would be prone to spasm    Echocardiogram 08/19/2018:  1. The left ventricle has normal systolic function with an ejection fraction of 60-65%. The cavity size was normal. Left ventricular diastolic Doppler parameters are consistent with impaired relaxation. Indeterminate filling pressures. 2. The right ventricle has normal systolic function. The cavity was normal. There is no increase in right ventricular wall thickness. 3. The mitral valve is grossly normal. 4. The tricuspid valve is grossly normal. 5. The aortic valve was not well visualized. Aortic valve regurgitation is mild by color flow Doppler. 6. The aorta is normal in size and structure. 7. The interatrial septum was not assessed. 8. Technically difficult study due to body habitus.  Labs/Other Tests and Data Reviewed:    EKG:  No ECG reviewed.  Recent Labs: 08/18/2018: BUN 20; Creat 1.15; Potassium 4.0; Sodium 138 08/23/2018: Hemoglobin 12.4; Platelets 242   Recent Lipid Panel No results found for: CHOL, TRIG, HDL, CHOLHDL, LDLCALC, LDLDIRECT  Wt Readings from Last 3 Encounters:  01/31/19 225 lb (102.1 kg)  12/21/18 225 lb (102.1 kg)  10/08/18 225 lb (102.1 kg)      Objective:    Vital Signs:  BP (!) 156/96   Pulse 63   Ht 5' 7.5" (1.715 m)   Wt 225 lb (102.1 kg)   BMI 34.72 kg/m    VITAL SIGNS:  reviewed  ASSESSMENT & PLAN:    1.  Coronary artery disease: This was mild and nonobstructive. Cardiac catheterization results did mention the possibility of vasospasm at the site of the lesion.  She is on metoprolol and simvastatin and Imdur 30 mg daily. Symptoms have improved.  2.  Hypertension: Blood pressure is elevated. Will need further monitoring.  3.  Insulin-dependent diabetes mellitus: On insulin and statin.  Most recent A1c 6.4%. Blood sugar was 54 this morning.  4.  Hypercholesterolemia: LDL 83 on 06/21/2018.  Continue simvastatin.    COVID-19 Education: The signs and symptoms of COVID-19 were discussed with the patient and how to seek care for testing (follow up with PCP or arrange E-visit).  The importance of social distancing was discussed today.  Time:   Today, I have spent 10 minutes with the patient with telehealth technology discussing the above problems.     Medication Adjustments/Labs and Tests Ordered: Current medicines are reviewed at length with the patient today.  Concerns regarding medicines are outlined above.   Tests Ordered: No orders of the defined types were placed in this encounter.   Medication Changes: No orders of the defined types were placed in this encounter.   Follow Up:  Virtual Visit  in 6 month(s)  Signed, Kate Sable, MD  01/31/2019 8:56 AM    [place]

## 2019-02-18 DIAGNOSIS — E1165 Type 2 diabetes mellitus with hyperglycemia: Secondary | ICD-10-CM | POA: Diagnosis not present

## 2019-02-18 DIAGNOSIS — I10 Essential (primary) hypertension: Secondary | ICD-10-CM | POA: Diagnosis not present

## 2019-03-17 DIAGNOSIS — I10 Essential (primary) hypertension: Secondary | ICD-10-CM | POA: Diagnosis not present

## 2019-03-17 DIAGNOSIS — E1165 Type 2 diabetes mellitus with hyperglycemia: Secondary | ICD-10-CM | POA: Diagnosis not present

## 2019-03-18 DIAGNOSIS — E1165 Type 2 diabetes mellitus with hyperglycemia: Secondary | ICD-10-CM | POA: Diagnosis not present

## 2019-03-18 DIAGNOSIS — I10 Essential (primary) hypertension: Secondary | ICD-10-CM | POA: Diagnosis not present

## 2019-04-04 DIAGNOSIS — K219 Gastro-esophageal reflux disease without esophagitis: Secondary | ICD-10-CM | POA: Diagnosis not present

## 2019-04-04 DIAGNOSIS — E1165 Type 2 diabetes mellitus with hyperglycemia: Secondary | ICD-10-CM | POA: Diagnosis not present

## 2019-04-04 DIAGNOSIS — I10 Essential (primary) hypertension: Secondary | ICD-10-CM | POA: Diagnosis not present

## 2019-04-04 DIAGNOSIS — E782 Mixed hyperlipidemia: Secondary | ICD-10-CM | POA: Diagnosis not present

## 2019-04-04 DIAGNOSIS — M109 Gout, unspecified: Secondary | ICD-10-CM | POA: Diagnosis not present

## 2019-04-04 DIAGNOSIS — E039 Hypothyroidism, unspecified: Secondary | ICD-10-CM | POA: Diagnosis not present

## 2019-04-11 DIAGNOSIS — L900 Lichen sclerosus et atrophicus: Secondary | ICD-10-CM | POA: Diagnosis not present

## 2019-04-11 DIAGNOSIS — E1165 Type 2 diabetes mellitus with hyperglycemia: Secondary | ICD-10-CM | POA: Diagnosis not present

## 2019-04-11 DIAGNOSIS — M545 Low back pain: Secondary | ICD-10-CM | POA: Diagnosis not present

## 2019-04-11 DIAGNOSIS — R609 Edema, unspecified: Secondary | ICD-10-CM | POA: Diagnosis not present

## 2019-04-11 DIAGNOSIS — E7800 Pure hypercholesterolemia, unspecified: Secondary | ICD-10-CM | POA: Diagnosis not present

## 2019-04-11 DIAGNOSIS — M109 Gout, unspecified: Secondary | ICD-10-CM | POA: Diagnosis not present

## 2019-04-11 DIAGNOSIS — I10 Essential (primary) hypertension: Secondary | ICD-10-CM | POA: Diagnosis not present

## 2019-04-11 DIAGNOSIS — Z6837 Body mass index (BMI) 37.0-37.9, adult: Secondary | ICD-10-CM | POA: Diagnosis not present

## 2019-04-19 DIAGNOSIS — F419 Anxiety disorder, unspecified: Secondary | ICD-10-CM | POA: Diagnosis not present

## 2019-04-19 DIAGNOSIS — E1165 Type 2 diabetes mellitus with hyperglycemia: Secondary | ICD-10-CM | POA: Diagnosis not present

## 2019-04-20 DIAGNOSIS — F419 Anxiety disorder, unspecified: Secondary | ICD-10-CM | POA: Diagnosis not present

## 2019-04-20 DIAGNOSIS — E1165 Type 2 diabetes mellitus with hyperglycemia: Secondary | ICD-10-CM | POA: Diagnosis not present

## 2019-05-19 DIAGNOSIS — E1165 Type 2 diabetes mellitus with hyperglycemia: Secondary | ICD-10-CM | POA: Diagnosis not present

## 2019-05-19 DIAGNOSIS — I10 Essential (primary) hypertension: Secondary | ICD-10-CM | POA: Diagnosis not present

## 2019-05-20 DIAGNOSIS — I10 Essential (primary) hypertension: Secondary | ICD-10-CM | POA: Diagnosis not present

## 2019-05-20 DIAGNOSIS — E1165 Type 2 diabetes mellitus with hyperglycemia: Secondary | ICD-10-CM | POA: Diagnosis not present

## 2019-06-20 DIAGNOSIS — I10 Essential (primary) hypertension: Secondary | ICD-10-CM | POA: Diagnosis not present

## 2019-06-20 DIAGNOSIS — E039 Hypothyroidism, unspecified: Secondary | ICD-10-CM | POA: Diagnosis not present

## 2019-06-20 DIAGNOSIS — E1140 Type 2 diabetes mellitus with diabetic neuropathy, unspecified: Secondary | ICD-10-CM | POA: Diagnosis not present

## 2019-06-20 DIAGNOSIS — Z7984 Long term (current) use of oral hypoglycemic drugs: Secondary | ICD-10-CM | POA: Diagnosis not present

## 2019-07-18 ENCOUNTER — Other Ambulatory Visit: Payer: Self-pay | Admitting: Cardiovascular Disease

## 2019-07-20 DIAGNOSIS — E039 Hypothyroidism, unspecified: Secondary | ICD-10-CM | POA: Diagnosis not present

## 2019-07-20 DIAGNOSIS — Z7984 Long term (current) use of oral hypoglycemic drugs: Secondary | ICD-10-CM | POA: Diagnosis not present

## 2019-07-20 DIAGNOSIS — E1140 Type 2 diabetes mellitus with diabetic neuropathy, unspecified: Secondary | ICD-10-CM | POA: Diagnosis not present

## 2019-07-20 DIAGNOSIS — I10 Essential (primary) hypertension: Secondary | ICD-10-CM | POA: Diagnosis not present

## 2019-08-01 ENCOUNTER — Other Ambulatory Visit: Payer: Self-pay

## 2019-08-01 ENCOUNTER — Ambulatory Visit (INDEPENDENT_AMBULATORY_CARE_PROVIDER_SITE_OTHER): Payer: PPO | Admitting: Family Medicine

## 2019-08-01 ENCOUNTER — Encounter: Payer: Self-pay | Admitting: Family Medicine

## 2019-08-01 VITALS — BP 122/82 | HR 66 | Ht 67.0 in | Wt 237.0 lb

## 2019-08-01 DIAGNOSIS — E119 Type 2 diabetes mellitus without complications: Secondary | ICD-10-CM | POA: Diagnosis not present

## 2019-08-01 DIAGNOSIS — E785 Hyperlipidemia, unspecified: Secondary | ICD-10-CM | POA: Diagnosis not present

## 2019-08-01 DIAGNOSIS — I2510 Atherosclerotic heart disease of native coronary artery without angina pectoris: Secondary | ICD-10-CM | POA: Diagnosis not present

## 2019-08-01 DIAGNOSIS — Z794 Long term (current) use of insulin: Secondary | ICD-10-CM

## 2019-08-01 DIAGNOSIS — I10 Essential (primary) hypertension: Secondary | ICD-10-CM | POA: Diagnosis not present

## 2019-08-01 NOTE — Patient Instructions (Addendum)
Your physician wants you to follow-up in: 6 MONTHS You will receive a reminder letter in the mail two months in advance. If you don't receive a letter, please call our office to schedule the follow-up appointment.  Your physician recommends that you continue on your current medications as directed. Please refer to the Current Medication list given to you today.  Thank you for choosing [facility]!!    

## 2019-08-01 NOTE — Progress Notes (Signed)
Cardiology Office Note  Date: 08/01/2019   ID: Mathews, Melanie 11-Feb-1945, MRN [id number]  PCP:  Selinda Flavin, MD  Cardiologist:  Prentice Docker, MD Electrophysiologist:  None   Chief Complaint: Follow-up chest pain unspecified  History of Present Illness: Melanie Mathews is a 74 y.o. female with a history of chest pain, status post cardiac catheterization 08/25/2018.  Nonobstructive CAD in LAD.    Last encounter with Dr. Purvis Sheffield via telemedicine on 01/31/2019 for follow-up chest pain.  At that time she expressed having seldom episodes of chest pains which were less frequent and mild.  She was wearing new socks for bilateral neuropathy in her feet and symptoms had improved.  Cardiac catheterization results did mention the possibility of vasospasm.  She was taking metoprolol, simvastatin and Imdur daily.  Symptoms had improved.  Blood pressure was elevated at last visit.  Dr. Purvis Sheffield mentioned further monitoring.  Her A1c at that time was 6.4%.  She was on oral and insulin agents.  Also taking statin.  Her LDL on 06/21/2018 was 83.  She was continuing simvastatin.  She presents today for follow-up.  States she just feels tired at times and has some lower leg edema in the left leg.  States she is having some issues with gout and has taken allopurinol and colchicine.  She denies any tachycardia, shortness of breath, or pleuritic chest pain.  He states her primary care provider is starting her on a new antihypertensive medication which is going to be protective of her kidneys due to her diabetes.  She is unsure of the name of the medication.  She denies any other anginal or exertional symptoms, palpitations or arrhythmias, orthostatic symptoms, PND, orthopnea, bleeding issues, claudication, DVT or PE-like symptoms.     Past Medical History:  Diagnosis Date  . Anxiety   . Diabetes mellitus   . Hernia   . Hypertension   . Hypothyroidism     Past Surgical History:  Procedure  Laterality Date  . ABDOMINAL HYSTERECTOMY    . CERVICAL FUSION    . CHOLECYSTECTOMY    . COLONOSCOPY    . COLONOSCOPY  07/03/2011   Procedure: COLONOSCOPY;  Surgeon: Malissa Hippo, MD;  [facility];  Service: Endoscopy;  Laterality: N/A;  1030  . ESOPHAGEAL DILATION N/A 04/19/2015   Procedure: ESOPHAGEAL DILATION;  Surgeon: Malissa Hippo, MD;  [facility];  Service: Endoscopy;  Laterality: N/A;  . ESOPHAGOGASTRODUODENOSCOPY N/A 04/19/2015   Procedure: ESOPHAGOGASTRODUODENOSCOPY (EGD);  Surgeon: Malissa Hippo, MD;  [facility];  Service: Endoscopy;  Laterality: N/A;  2:05  . KNEE SURGERY     Arthroscopic  . LEFT HEART CATH AND CORONARY ANGIOGRAPHY N/A 08/25/2018   Procedure: LEFT HEART CATH AND CORONARY ANGIOGRAPHY;  Surgeon: Marykay Lex, MD;  [facility] INVASIVE CV LAB;  Service: Cardiovascular;  Laterality: N/A;  . UPPER GASTROINTESTINAL ENDOSCOPY      Current Outpatient Medications  Medication Sig Dispense Refill  . acetaminophen (TYLENOL) 650 MG CR tablet Take 1,300 mg by mouth every 8 (eight) hours as needed for pain.    Marland Kitchen allopurinol (ZYLOPRIM) 300 MG tablet Take 300 mg by mouth daily.     . Calcium Carb-Cholecalciferol (CALCIUM-VITAMIN D3) 500-400 MG-UNIT TABS Take 1 tablet by mouth daily.    . colchicine 0.6 MG tablet Take 1 tablet (0.6 mg total) by mouth 2 (two) times daily. 60 tablet 3  . DULoxetine (CYMBALTA) 60 MG capsule Take 60  mg by mouth 2 (two) times daily.     . furosemide (LASIX) 40 MG tablet Take 40 mg by mouth daily.     Marland Kitchen gabapentin (NEURONTIN) 800 MG tablet Take 1,600 mg by mouth 2 (two) times daily.     . insulin glargine (LANTUS) 100 UNIT/ML injection Inject 12-40 Units into the skin See admin instructions. Inject 40 units in the morning and 12 units at night    . isosorbide mononitrate (IMDUR) 30 MG 24 hr tablet TAKE ONE TABLET BY MOUTH EVERY MORNING 90 tablet 3  . ketoconazole (NIZORAL) 2 % cream Apply 1 application  topically daily as needed (foot fungus).    Marland Kitchen levothyroxine (SYNTHROID) 125 MCG tablet Take 125 mcg by mouth daily.     . metFORMIN (GLUCOPHAGE) 500 MG tablet Take 1 tablet by mouth 2 (two) times a day.    . metoprolol tartrate (LOPRESSOR) 50 MG tablet Take 50 mg by mouth 2 (two) times a day.     Marland Kitchen NEXIUM 40 MG capsule TAKE ONE CAPSULE ONCE DAILY (Patient taking differently: Take 40 mg by mouth daily. ) 30 capsule 11  . Probiotic Product (PROBIOTIC-10 PO) Take 1 tablet by mouth daily.    . simvastatin (ZOCOR) 40 MG tablet Take 40 mg by mouth at bedtime.     . traMADol (ULTRAM) 50 MG tablet Take 50 mg by mouth See admin instructions. Take 50 mg by mouth twice daily, may take a third 50 mg dose mid day as needed for pain    . Turmeric 500 MG CAPS Take 500 mg by mouth 2 (two) times daily.    . Zinc Sulfate (ZINC 15 PO) Take by mouth.     Current Facility-Administered Medications  Medication Dose Route Frequency Provider Last Rate Last Admin  . sodium chloride flush (NS) 0.9 % injection 3 mL  3 mL Intravenous Q12H Laqueta Linden, MD       Allergies:  Sulfur and Adhesive [tape]   Social History: The patient  reports that she quit smoking about 28 years ago. Her smoking use included cigarettes. She has a 1.50 pack-year smoking history. She has never used smokeless tobacco. She reports that she does not drink alcohol and does not use drugs.   Family History: The patient's family history includes Arthritis in her mother and sister; COPD in her sister; Diabetes in her sister; Pancreatic cancer in her father.   ROS:  Please see the history of present illness. Otherwise, complete review of systems is positive for none.  All other systems are reviewed and negative.   Physical Exam: VS:  BP 122/82   Pulse 66   Ht 5\' 7"  (1.702 m)   Wt 237 lb (107.5 kg)   SpO2 98%   BMI 37.12 kg/m , BMI Body mass index is 37.12 kg/m.  Wt Readings from Last 3 Encounters:  08/01/19 237 lb (107.5 kg)  01/31/19  225 lb (102.1 kg)  12/21/18 225 lb (102.1 kg)    General: Patient appears comfortable at rest. Neck: Supple, no elevated JVP or carotid bruits, no thyromegaly. Lungs: Clear to auscultation, nonlabored breathing at rest. Cardiac: Regular rate and rhythm, no S3 or significant systolic murmur, no pericardial rub. Abdomen: Soft, nontender, no hepatomegaly, bowel sounds present, no guarding or rebound. Extremities: Mild none pitting edema left leg, distal pulses 2+. Skin: Warm and dry. Musculoskeletal: No kyphosis. Neuropsychiatric: Alert and oriented x3, affect grossly appropriate.  ECG:    Recent Labwork: 08/18/2018: BUN 20; Creat 1.15;  Potassium 4.0; Sodium 138 08/23/2018: Hemoglobin 12.4; Platelets 242  No results found for: CHOL, TRIG, HDL, CHOLHDL, VLDL, LDLCALC, LDLDIRECT  Other Studies Reviewed Today:   Cardiac catheterization 08/25/2018:   Prox LAD lesion is 40% stenosed. = DFR 0.91 with an FFR 0.85.  Mid LAD lesion is 30% stenosed. Dist LAD lesion is 50% stenosed.  Otherwise angiographically normal coronary arteries with a left dominant system.  Normal LVEDP  SUMMARY  Angiographically minimal very proximal LAD lesion that surprisingly was borderline by DFR and moderate stenosis by FFR (0.85 --it is possible that FFR is considered from the distal vessel that this would have been an positive if measured at the apex) --> not physiologically significant by FFR therefore no PCI  Otherwise minimal CAD with left dominant system.  Normal LVEDP   RECOMMENDATIONS  Discharge home after bed rest per PCI protocol based on extra heparin given for FFR  Follow-up with Dr. Purvis Sheffield.  Recommend aggressive risk factor modification and consider treatment with antispasm medications as this lesion would be prone to spasm    Echocardiogram 08/19/2018:  1. The left ventricle has normal systolic function with an ejection fraction of 60-65%. The cavity size was normal. Left  ventricular diastolic Doppler parameters are consistent with impaired relaxation. Indeterminate filling pressures. 2. The right ventricle has normal systolic function. The cavity was normal. There is no increase in right ventricular wall thickness. 3. The mitral valve is grossly normal. 4. The tricuspid valve is grossly normal. 5. The aortic valve was not well visualized. Aortic valve regurgitation is mild by color flow Doppler. 6. The aorta is normal in size and structure. 7. The interatrial septum was not assessed. 8. Technically difficult study due to body habitus.  Assessment and Plan:  1. CAD in native artery   2. Essential hypertension   3. Hyperlipidemia LDL goal <70   4. Diabetes mellitus type 2, insulin dependent (HCC)    1. CAD in native artery Denies any progressive anginal or exertional symptoms.  History of nonobstructive CAD on cardiac catheterization 08/25/2018.  Proximal LAD 40%, mid LAD 30%.  Distal LAD 50%.  Otherwise normal coronary arteries with left dominant system.  Continue Imdur 30 mg daily.  Metoprolol tartrate 50 mg p.o. twice daily.  2. Essential hypertension States her blood pressure has been up some recently.  She states her PCP is placing her on an antihypertensive protective of her kidney.  States she does not know the name of the medication.  Blood pressure today 122/82.  Continue metoprolol 50 mg p.o. twice daily.  Lasix 40 mg daily.  3. Hyperlipidemia LDL goal <70 Total cholesterol 158, triglycerides 130, HDL 47, LDL 83 on June 21, 2018. Continue simvastatin 40 mg daily.  4. Diabetes mellitus type 2, insulin dependent (HCC) States her most recent hemoglobin A1c was 6.7%.  States she has been eating a little too much ice cream this summer.  Managed by PCP on Metformin and Lantus.  Medication Adjustments/Labs and Tests Ordered: Current medicines are reviewed at length with the patient today.  Concerns regarding medicines are outlined above.    Disposition: Follow-up with Dr. Wyline Mood or APP 6 months  Signed, Rennis Harding, NP 08/01/2019 4:12 PM    [facility] at [facility] [address] [place], [place], [place] [address] Phone: [phone]; Fax: [phone]

## 2019-08-19 DIAGNOSIS — Z7984 Long term (current) use of oral hypoglycemic drugs: Secondary | ICD-10-CM | POA: Diagnosis not present

## 2019-08-19 DIAGNOSIS — I10 Essential (primary) hypertension: Secondary | ICD-10-CM | POA: Diagnosis not present

## 2019-08-19 DIAGNOSIS — E039 Hypothyroidism, unspecified: Secondary | ICD-10-CM | POA: Diagnosis not present

## 2019-08-19 DIAGNOSIS — E1140 Type 2 diabetes mellitus with diabetic neuropathy, unspecified: Secondary | ICD-10-CM | POA: Diagnosis not present

## 2019-08-22 DIAGNOSIS — E039 Hypothyroidism, unspecified: Secondary | ICD-10-CM | POA: Diagnosis not present

## 2019-08-22 DIAGNOSIS — M109 Gout, unspecified: Secondary | ICD-10-CM | POA: Diagnosis not present

## 2019-08-22 DIAGNOSIS — E1140 Type 2 diabetes mellitus with diabetic neuropathy, unspecified: Secondary | ICD-10-CM | POA: Diagnosis not present

## 2019-08-22 DIAGNOSIS — E1165 Type 2 diabetes mellitus with hyperglycemia: Secondary | ICD-10-CM | POA: Diagnosis not present

## 2019-08-22 DIAGNOSIS — E782 Mixed hyperlipidemia: Secondary | ICD-10-CM | POA: Diagnosis not present

## 2019-08-22 DIAGNOSIS — R5383 Other fatigue: Secondary | ICD-10-CM | POA: Diagnosis not present

## 2019-08-22 DIAGNOSIS — K219 Gastro-esophageal reflux disease without esophagitis: Secondary | ICD-10-CM | POA: Diagnosis not present

## 2019-08-22 DIAGNOSIS — E7800 Pure hypercholesterolemia, unspecified: Secondary | ICD-10-CM | POA: Diagnosis not present

## 2019-08-22 DIAGNOSIS — I10 Essential (primary) hypertension: Secondary | ICD-10-CM | POA: Diagnosis not present

## 2019-08-26 DIAGNOSIS — I10 Essential (primary) hypertension: Secondary | ICD-10-CM | POA: Diagnosis not present

## 2019-08-26 DIAGNOSIS — Z6837 Body mass index (BMI) 37.0-37.9, adult: Secondary | ICD-10-CM | POA: Diagnosis not present

## 2019-08-26 DIAGNOSIS — F411 Generalized anxiety disorder: Secondary | ICD-10-CM | POA: Diagnosis not present

## 2019-08-26 DIAGNOSIS — M109 Gout, unspecified: Secondary | ICD-10-CM | POA: Diagnosis not present

## 2019-08-26 DIAGNOSIS — R609 Edema, unspecified: Secondary | ICD-10-CM | POA: Diagnosis not present

## 2019-08-26 DIAGNOSIS — R413 Other amnesia: Secondary | ICD-10-CM | POA: Diagnosis not present

## 2019-08-26 DIAGNOSIS — Z0001 Encounter for general adult medical examination with abnormal findings: Secondary | ICD-10-CM | POA: Diagnosis not present

## 2019-08-26 DIAGNOSIS — E1165 Type 2 diabetes mellitus with hyperglycemia: Secondary | ICD-10-CM | POA: Diagnosis not present

## 2019-08-31 DIAGNOSIS — R404 Transient alteration of awareness: Secondary | ICD-10-CM | POA: Diagnosis not present

## 2019-08-31 DIAGNOSIS — S199XXA Unspecified injury of neck, initial encounter: Secondary | ICD-10-CM | POA: Diagnosis not present

## 2019-08-31 DIAGNOSIS — S161XXA Strain of muscle, fascia and tendon at neck level, initial encounter: Secondary | ICD-10-CM | POA: Diagnosis not present

## 2019-08-31 DIAGNOSIS — R112 Nausea with vomiting, unspecified: Secondary | ICD-10-CM | POA: Diagnosis not present

## 2019-08-31 DIAGNOSIS — W01198A Fall on same level from slipping, tripping and stumbling with subsequent striking against other object, initial encounter: Secondary | ICD-10-CM | POA: Diagnosis not present

## 2019-08-31 DIAGNOSIS — I7090 Unspecified atherosclerosis: Secondary | ICD-10-CM | POA: Diagnosis not present

## 2019-08-31 DIAGNOSIS — M4312 Spondylolisthesis, cervical region: Secondary | ICD-10-CM | POA: Diagnosis not present

## 2019-08-31 DIAGNOSIS — S0990XA Unspecified injury of head, initial encounter: Secondary | ICD-10-CM | POA: Diagnosis not present

## 2019-08-31 DIAGNOSIS — A045 Campylobacter enteritis: Secondary | ICD-10-CM | POA: Diagnosis not present

## 2019-08-31 DIAGNOSIS — Z87891 Personal history of nicotine dependence: Secondary | ICD-10-CM | POA: Diagnosis not present

## 2019-08-31 DIAGNOSIS — G319 Degenerative disease of nervous system, unspecified: Secondary | ICD-10-CM | POA: Diagnosis not present

## 2019-08-31 DIAGNOSIS — F329 Major depressive disorder, single episode, unspecified: Secondary | ICD-10-CM | POA: Diagnosis not present

## 2019-08-31 DIAGNOSIS — G44319 Acute post-traumatic headache, not intractable: Secondary | ICD-10-CM | POA: Diagnosis not present

## 2019-08-31 DIAGNOSIS — E119 Type 2 diabetes mellitus without complications: Secondary | ICD-10-CM | POA: Diagnosis not present

## 2019-08-31 DIAGNOSIS — R519 Headache, unspecified: Secondary | ICD-10-CM | POA: Diagnosis not present

## 2019-08-31 DIAGNOSIS — M47812 Spondylosis without myelopathy or radiculopathy, cervical region: Secondary | ICD-10-CM | POA: Diagnosis not present

## 2019-08-31 DIAGNOSIS — Z20822 Contact with and (suspected) exposure to covid-19: Secondary | ICD-10-CM | POA: Diagnosis not present

## 2019-08-31 DIAGNOSIS — R600 Localized edema: Secondary | ICD-10-CM | POA: Diagnosis not present

## 2019-08-31 DIAGNOSIS — F4321 Adjustment disorder with depressed mood: Secondary | ICD-10-CM | POA: Diagnosis not present

## 2019-08-31 DIAGNOSIS — R42 Dizziness and giddiness: Secondary | ICD-10-CM | POA: Diagnosis not present

## 2019-08-31 DIAGNOSIS — I10 Essential (primary) hypertension: Secondary | ICD-10-CM | POA: Diagnosis not present

## 2019-09-05 DIAGNOSIS — M542 Cervicalgia: Secondary | ICD-10-CM | POA: Diagnosis not present

## 2019-09-05 DIAGNOSIS — R197 Diarrhea, unspecified: Secondary | ICD-10-CM | POA: Diagnosis not present

## 2019-09-20 DIAGNOSIS — E1140 Type 2 diabetes mellitus with diabetic neuropathy, unspecified: Secondary | ICD-10-CM | POA: Diagnosis not present

## 2019-09-20 DIAGNOSIS — Z7984 Long term (current) use of oral hypoglycemic drugs: Secondary | ICD-10-CM | POA: Diagnosis not present

## 2019-09-20 DIAGNOSIS — E039 Hypothyroidism, unspecified: Secondary | ICD-10-CM | POA: Diagnosis not present

## 2019-09-20 DIAGNOSIS — I10 Essential (primary) hypertension: Secondary | ICD-10-CM | POA: Diagnosis not present

## 2019-09-28 DIAGNOSIS — M5412 Radiculopathy, cervical region: Secondary | ICD-10-CM | POA: Diagnosis not present

## 2019-10-06 DIAGNOSIS — M5126 Other intervertebral disc displacement, lumbar region: Secondary | ICD-10-CM | POA: Diagnosis not present

## 2019-10-06 DIAGNOSIS — M4802 Spinal stenosis, cervical region: Secondary | ICD-10-CM | POA: Diagnosis not present

## 2019-10-06 DIAGNOSIS — M48062 Spinal stenosis, lumbar region with neurogenic claudication: Secondary | ICD-10-CM | POA: Diagnosis not present

## 2019-10-06 DIAGNOSIS — M50122 Cervical disc disorder at C5-C6 level with radiculopathy: Secondary | ICD-10-CM | POA: Diagnosis not present

## 2019-10-06 DIAGNOSIS — M5412 Radiculopathy, cervical region: Secondary | ICD-10-CM | POA: Diagnosis not present

## 2019-10-12 DIAGNOSIS — M48061 Spinal stenosis, lumbar region without neurogenic claudication: Secondary | ICD-10-CM | POA: Diagnosis not present

## 2019-10-14 ENCOUNTER — Telehealth: Payer: Self-pay | Admitting: Family Medicine

## 2019-10-14 NOTE — Telephone Encounter (Signed)
   Primary Cardiologist: Prentice Docker, MD (Inactive)  Chart reviewed as part of pre-operative protocol coverage. Given past medical history and time since last visit, based on ACC/AHA guidelines, Melanie Mathews would be at acceptable risk for the planned procedure without further cardiovascular testing.   I will route this recommendation to the requesting party via Epic fax function and remove from pre-op pool.  Please call with questions.  Melanie Mare. [name] DNP, ANP, AACC  10/14/2019, 9:50 AM

## 2019-10-14 NOTE — Telephone Encounter (Signed)
   [facility] Pre-operative Risk Assessment    HEARTCARE STAFF: - Please ensure there is not already an duplicate clearance open for this procedure. - Under Visit Info/Reason for Call, type in Other and utilize the format Clearance MM/DD/YY or Clearance TBD. Do not use dashes or single digits. - If request is for dental extraction, please clarify the # of teeth to be extracted.  Request for surgical clearance:  1. What type of surgery is being performed? L3-4 LUMBAR LAMINECTOMY  2. When is this surgery scheduled? PENDING   3. What type of clearance is required (medical clearance vs. Pharmacy clearance to hold med vs. Both)? MEDICAL   4. Are there any medications that need to be held prior to surgery and how long? NONE NOTED   Practice name and name of physician performing surgery? [facility]  5. What is the office phone number? [phone]   7.   What is the office fax number? [phone]  8.   Anesthesia type (None, local, MAC, general) ? GENERAL    Chanda Busing 10/14/2019, 8:25 AM  _________________________________________________________________   (provider comments below)

## 2019-10-20 DIAGNOSIS — E1140 Type 2 diabetes mellitus with diabetic neuropathy, unspecified: Secondary | ICD-10-CM | POA: Diagnosis not present

## 2019-10-20 DIAGNOSIS — I10 Essential (primary) hypertension: Secondary | ICD-10-CM | POA: Diagnosis not present

## 2019-10-20 DIAGNOSIS — E039 Hypothyroidism, unspecified: Secondary | ICD-10-CM | POA: Diagnosis not present

## 2019-10-20 DIAGNOSIS — Z7984 Long term (current) use of oral hypoglycemic drugs: Secondary | ICD-10-CM | POA: Diagnosis not present

## 2019-11-18 DIAGNOSIS — I10 Essential (primary) hypertension: Secondary | ICD-10-CM | POA: Diagnosis not present

## 2019-11-18 DIAGNOSIS — Z7982 Long term (current) use of aspirin: Secondary | ICD-10-CM | POA: Diagnosis not present

## 2019-11-18 DIAGNOSIS — F330 Major depressive disorder, recurrent, mild: Secondary | ICD-10-CM | POA: Diagnosis not present

## 2019-11-18 DIAGNOSIS — K219 Gastro-esophageal reflux disease without esophagitis: Secondary | ICD-10-CM | POA: Diagnosis not present

## 2019-11-18 DIAGNOSIS — Z87891 Personal history of nicotine dependence: Secondary | ICD-10-CM | POA: Diagnosis not present

## 2019-11-18 DIAGNOSIS — Z6835 Body mass index (BMI) 35.0-35.9, adult: Secondary | ICD-10-CM | POA: Diagnosis not present

## 2019-11-18 DIAGNOSIS — I739 Peripheral vascular disease, unspecified: Secondary | ICD-10-CM | POA: Diagnosis not present

## 2019-11-18 DIAGNOSIS — I25118 Atherosclerotic heart disease of native coronary artery with other forms of angina pectoris: Secondary | ICD-10-CM | POA: Diagnosis not present

## 2019-11-18 DIAGNOSIS — E039 Hypothyroidism, unspecified: Secondary | ICD-10-CM | POA: Diagnosis not present

## 2019-11-18 DIAGNOSIS — G8929 Other chronic pain: Secondary | ICD-10-CM | POA: Diagnosis not present

## 2019-11-19 DIAGNOSIS — I10 Essential (primary) hypertension: Secondary | ICD-10-CM | POA: Diagnosis not present

## 2019-11-19 DIAGNOSIS — E1140 Type 2 diabetes mellitus with diabetic neuropathy, unspecified: Secondary | ICD-10-CM | POA: Diagnosis not present

## 2019-11-19 DIAGNOSIS — Z7984 Long term (current) use of oral hypoglycemic drugs: Secondary | ICD-10-CM | POA: Diagnosis not present

## 2019-11-19 DIAGNOSIS — E039 Hypothyroidism, unspecified: Secondary | ICD-10-CM | POA: Diagnosis not present

## 2019-12-02 DIAGNOSIS — E1140 Type 2 diabetes mellitus with diabetic neuropathy, unspecified: Secondary | ICD-10-CM | POA: Diagnosis not present

## 2019-12-02 DIAGNOSIS — R5383 Other fatigue: Secondary | ICD-10-CM | POA: Diagnosis not present

## 2019-12-02 DIAGNOSIS — E782 Mixed hyperlipidemia: Secondary | ICD-10-CM | POA: Diagnosis not present

## 2019-12-02 DIAGNOSIS — I10 Essential (primary) hypertension: Secondary | ICD-10-CM | POA: Diagnosis not present

## 2019-12-02 DIAGNOSIS — M109 Gout, unspecified: Secondary | ICD-10-CM | POA: Diagnosis not present

## 2019-12-02 DIAGNOSIS — E7800 Pure hypercholesterolemia, unspecified: Secondary | ICD-10-CM | POA: Diagnosis not present

## 2019-12-02 DIAGNOSIS — E1165 Type 2 diabetes mellitus with hyperglycemia: Secondary | ICD-10-CM | POA: Diagnosis not present

## 2019-12-02 DIAGNOSIS — E039 Hypothyroidism, unspecified: Secondary | ICD-10-CM | POA: Diagnosis not present

## 2019-12-02 DIAGNOSIS — K219 Gastro-esophageal reflux disease without esophagitis: Secondary | ICD-10-CM | POA: Diagnosis not present

## 2019-12-07 DIAGNOSIS — E1165 Type 2 diabetes mellitus with hyperglycemia: Secondary | ICD-10-CM | POA: Diagnosis not present

## 2019-12-07 DIAGNOSIS — R413 Other amnesia: Secondary | ICD-10-CM | POA: Diagnosis not present

## 2019-12-07 DIAGNOSIS — K219 Gastro-esophageal reflux disease without esophagitis: Secondary | ICD-10-CM | POA: Diagnosis not present

## 2019-12-07 DIAGNOSIS — M109 Gout, unspecified: Secondary | ICD-10-CM | POA: Diagnosis not present

## 2019-12-07 DIAGNOSIS — R609 Edema, unspecified: Secondary | ICD-10-CM | POA: Diagnosis not present

## 2019-12-07 DIAGNOSIS — Z23 Encounter for immunization: Secondary | ICD-10-CM | POA: Diagnosis not present

## 2019-12-07 DIAGNOSIS — I10 Essential (primary) hypertension: Secondary | ICD-10-CM | POA: Diagnosis not present

## 2019-12-07 DIAGNOSIS — F411 Generalized anxiety disorder: Secondary | ICD-10-CM | POA: Diagnosis not present

## 2019-12-20 DIAGNOSIS — E1140 Type 2 diabetes mellitus with diabetic neuropathy, unspecified: Secondary | ICD-10-CM | POA: Diagnosis not present

## 2019-12-20 DIAGNOSIS — Z7984 Long term (current) use of oral hypoglycemic drugs: Secondary | ICD-10-CM | POA: Diagnosis not present

## 2019-12-20 DIAGNOSIS — I10 Essential (primary) hypertension: Secondary | ICD-10-CM | POA: Diagnosis not present

## 2019-12-29 DIAGNOSIS — M79673 Pain in unspecified foot: Secondary | ICD-10-CM | POA: Diagnosis not present

## 2019-12-29 DIAGNOSIS — G609 Hereditary and idiopathic neuropathy, unspecified: Secondary | ICD-10-CM | POA: Diagnosis not present

## 2020-01-18 DIAGNOSIS — Z20828 Contact with and (suspected) exposure to other viral communicable diseases: Secondary | ICD-10-CM | POA: Diagnosis not present

## 2020-01-18 DIAGNOSIS — E039 Hypothyroidism, unspecified: Secondary | ICD-10-CM | POA: Diagnosis not present

## 2020-01-20 DIAGNOSIS — I10 Essential (primary) hypertension: Secondary | ICD-10-CM | POA: Diagnosis not present

## 2020-01-20 DIAGNOSIS — Z7984 Long term (current) use of oral hypoglycemic drugs: Secondary | ICD-10-CM | POA: Diagnosis not present

## 2020-01-20 DIAGNOSIS — E1140 Type 2 diabetes mellitus with diabetic neuropathy, unspecified: Secondary | ICD-10-CM | POA: Diagnosis not present

## 2020-01-26 DIAGNOSIS — Z20828 Contact with and (suspected) exposure to other viral communicable diseases: Secondary | ICD-10-CM | POA: Diagnosis not present

## 2020-01-26 DIAGNOSIS — Z20822 Contact with and (suspected) exposure to covid-19: Secondary | ICD-10-CM | POA: Diagnosis not present

## 2020-02-03 DIAGNOSIS — Z1231 Encounter for screening mammogram for malignant neoplasm of breast: Secondary | ICD-10-CM | POA: Diagnosis not present

## 2020-02-09 DIAGNOSIS — M79673 Pain in unspecified foot: Secondary | ICD-10-CM | POA: Diagnosis not present

## 2020-02-09 DIAGNOSIS — G609 Hereditary and idiopathic neuropathy, unspecified: Secondary | ICD-10-CM | POA: Diagnosis not present

## 2020-02-18 DIAGNOSIS — Z7984 Long term (current) use of oral hypoglycemic drugs: Secondary | ICD-10-CM | POA: Diagnosis not present

## 2020-02-18 DIAGNOSIS — I10 Essential (primary) hypertension: Secondary | ICD-10-CM | POA: Diagnosis not present

## 2020-02-18 DIAGNOSIS — E1140 Type 2 diabetes mellitus with diabetic neuropathy, unspecified: Secondary | ICD-10-CM | POA: Diagnosis not present

## 2020-02-18 DIAGNOSIS — E039 Hypothyroidism, unspecified: Secondary | ICD-10-CM | POA: Diagnosis not present

## 2020-02-20 DIAGNOSIS — E2839 Other primary ovarian failure: Secondary | ICD-10-CM | POA: Diagnosis not present

## 2020-02-20 DIAGNOSIS — M810 Age-related osteoporosis without current pathological fracture: Secondary | ICD-10-CM | POA: Diagnosis not present

## 2020-03-01 DIAGNOSIS — E1140 Type 2 diabetes mellitus with diabetic neuropathy, unspecified: Secondary | ICD-10-CM | POA: Diagnosis not present

## 2020-03-01 DIAGNOSIS — E7800 Pure hypercholesterolemia, unspecified: Secondary | ICD-10-CM | POA: Diagnosis not present

## 2020-03-01 DIAGNOSIS — E7801 Familial hypercholesterolemia: Secondary | ICD-10-CM | POA: Diagnosis not present

## 2020-03-01 DIAGNOSIS — E7849 Other hyperlipidemia: Secondary | ICD-10-CM | POA: Diagnosis not present

## 2020-03-01 DIAGNOSIS — E1165 Type 2 diabetes mellitus with hyperglycemia: Secondary | ICD-10-CM | POA: Diagnosis not present

## 2020-03-01 DIAGNOSIS — M109 Gout, unspecified: Secondary | ICD-10-CM | POA: Diagnosis not present

## 2020-03-01 DIAGNOSIS — E039 Hypothyroidism, unspecified: Secondary | ICD-10-CM | POA: Diagnosis not present

## 2020-03-01 DIAGNOSIS — E782 Mixed hyperlipidemia: Secondary | ICD-10-CM | POA: Diagnosis not present

## 2020-03-01 DIAGNOSIS — I10 Essential (primary) hypertension: Secondary | ICD-10-CM | POA: Diagnosis not present

## 2020-03-07 DIAGNOSIS — E7801 Familial hypercholesterolemia: Secondary | ICD-10-CM | POA: Diagnosis not present

## 2020-03-07 DIAGNOSIS — E1165 Type 2 diabetes mellitus with hyperglycemia: Secondary | ICD-10-CM | POA: Diagnosis not present

## 2020-03-07 DIAGNOSIS — R609 Edema, unspecified: Secondary | ICD-10-CM | POA: Diagnosis not present

## 2020-03-07 DIAGNOSIS — S62305A Unspecified fracture of fourth metacarpal bone, left hand, initial encounter for closed fracture: Secondary | ICD-10-CM | POA: Diagnosis not present

## 2020-03-07 DIAGNOSIS — Z23 Encounter for immunization: Secondary | ICD-10-CM | POA: Diagnosis not present

## 2020-03-07 DIAGNOSIS — M109 Gout, unspecified: Secondary | ICD-10-CM | POA: Diagnosis not present

## 2020-03-07 DIAGNOSIS — I10 Essential (primary) hypertension: Secondary | ICD-10-CM | POA: Diagnosis not present

## 2020-03-07 DIAGNOSIS — E039 Hypothyroidism, unspecified: Secondary | ICD-10-CM | POA: Diagnosis not present

## 2020-03-08 NOTE — Progress Notes (Signed)
Cardiology Office Note  Date: 03/09/2020   ID: Melanie Mathews, Melanie Mathews 01-25-1945, MRN [id number]  PCP:  Royann Shivers, PA-C  Cardiologist:  No primary care provider on file. Electrophysiologist:  None   Chief Complaint: Follow-up chest pain unspecified  History of Present Illness: Melanie Mathews is a 75 y.o. female with a history of chest pain, status post cardiac catheterization 08/25/2018.  Nonobstructive CAD in LAD.    Last encounter with Dr. Purvis Sheffield via telemedicine on 01/31/2019 for follow-up chest pain.  At that time she expressed having seldom episodes of chest pains which were less frequent and mild.  She was wearing new socks for bilateral neuropathy in her feet and symptoms had improved.  Cardiac catheterization results did mention the possibility of vasospasm.  She was taking metoprolol, simvastatin and Imdur daily.  Symptoms had improved.  Blood pressure was elevated at last visit.  Dr. Purvis Sheffield mentioned further monitoring.  Her A1c at that time was 6.4%.  She was on oral and insulin agents.  Also taking statin.  Her LDL on 06/21/2018 was 83.  She was continuing simvastatin.  She presented August 01, 2019 for follow-up.  Stated she just felt tired at times and had some lower leg edema in the left leg.  Stated she was having some issues with gout and has taken allopurinol and colchicine.  She denied any tachycardia, shortness of breath, or pleuritic chest pain.  She stated her primary care provider started her on a new antihypertensive medication which was going to be protective of her kidneys due to her diabetes.  She was unsure of the name of the medication.  She denied any other anginal or exertional symptoms, palpitations or arrhythmias, orthostatic symptoms, PND, orthopnea, bleeding issues, claudication, DVT or PE-like symptoms.    He presents today for 12-month follow-up.  She denies any issues other than a fractured left hand for which she is seeing orthopedics soon.   Denies any anginal or exertional symptoms.  States she notices occasional coronary vasospasm which is very short-lived.  States these occurrences have been rare.  Blood pressure is stable at 120/68.  Heart rate is 70.  She denies any PND or orthopnea.  She states she does sleep on 2 pillows.  She has history of sleep apnea but does not use CPAP due to not being able to tolerate the apparatus.  She has some knee arthritis which limits her mobility and ability to walk and exercise.   Past Medical History:  Diagnosis Date  . Anxiety   . Diabetes mellitus   . Hernia   . Hypertension   . Hypothyroidism     Past Surgical History:  Procedure Laterality Date  . ABDOMINAL HYSTERECTOMY    . CERVICAL FUSION    . CHOLECYSTECTOMY    . COLONOSCOPY    . COLONOSCOPY  07/03/2011   Procedure: COLONOSCOPY;  Surgeon: Malissa Hippo, MD;  [facility];  Service: Endoscopy;  Laterality: N/A;  1030  . ESOPHAGEAL DILATION N/A 04/19/2015   Procedure: ESOPHAGEAL DILATION;  Surgeon: Malissa Hippo, MD;  [facility]: [facility];  Service: Endoscopy;  Laterality: N/A;  . ESOPHAGOGASTRODUODENOSCOPY N/A 04/19/2015   Procedure: ESOPHAGOGASTRODUODENOSCOPY (EGD);  Surgeon: Malissa Hippo, MD;  [facility];  Service: Endoscopy;  Laterality: N/A;  2:05  . KNEE SURGERY     Arthroscopic  . LEFT HEART CATH AND CORONARY ANGIOGRAPHY N/A 08/25/2018   Procedure: LEFT HEART CATH AND CORONARY ANGIOGRAPHY;  Surgeon:  Marykay Lex, MD;  Location: [facility] INVASIVE CV LAB;  Service: Cardiovascular;  Laterality: N/A;  . UPPER GASTROINTESTINAL ENDOSCOPY      Current Outpatient Medications  Medication Sig Dispense Refill  . acetaminophen (TYLENOL) 650 MG CR tablet Take 1,300 mg by mouth every 8 (eight) hours as needed for pain.    Marland Kitchen allopurinol (ZYLOPRIM) 300 MG tablet Take 300 mg by mouth daily.     . Calcium Carb-Cholecalciferol (CALCIUM-VITAMIN D3) 500-400 MG-UNIT TABS Take 1 tablet by mouth daily.    .  colchicine 0.6 MG tablet Take 0.6 mg by mouth as needed.    . DULoxetine (CYMBALTA) 60 MG capsule Take 60 mg by mouth 2 (two) times daily.     . furosemide (LASIX) 40 MG tablet Take 40 mg by mouth daily.     . insulin glargine (LANTUS) 100 UNIT/ML injection Inject 35 Units into the skin daily.    . isosorbide mononitrate (IMDUR) 30 MG 24 hr tablet TAKE ONE TABLET BY MOUTH EVERY MORNING 90 tablet 3  . ketoconazole (NIZORAL) 2 % cream Apply 1 application topically daily as needed (foot fungus).    Marland Kitchen levothyroxine (SYNTHROID) 150 MCG tablet Take 150 mcg by mouth daily before breakfast.    . metFORMIN (GLUCOPHAGE) 500 MG tablet Take 1 tablet by mouth 2 (two) times a day.    . metoprolol tartrate (LOPRESSOR) 50 MG tablet Take 50 mg by mouth 2 (two) times a day.     Marland Kitchen NEXIUM 40 MG capsule TAKE ONE CAPSULE ONCE DAILY (Patient taking differently: Take 40 mg by mouth daily.) 30 capsule 11  . pregabalin (LYRICA) 225 MG capsule Take 225 mg by mouth 2 (two) times daily.    . Probiotic Product (PROBIOTIC-10 PO) Take 1 tablet by mouth daily.    . simvastatin (ZOCOR) 40 MG tablet Take 40 mg by mouth at bedtime.     . traMADol (ULTRAM) 50 MG tablet Take 50 mg by mouth See admin instructions. Take 50 mg by mouth twice daily, may take a third 50 mg dose mid day as needed for pain    . Turmeric 500 MG CAPS Take 500 mg by mouth 2 (two) times daily.    . Zinc Sulfate (ZINC 15 PO) Take by mouth.     Current Facility-Administered Medications  Medication Dose Route Frequency Provider Last Rate Last Admin  . sodium chloride flush (NS) 0.9 % injection 3 mL  3 mL Intravenous Q12H Laqueta Linden, MD       Allergies:  Elemental sulfur and Adhesive [tape]   Social History: The patient  reports that she quit smoking about 28 years ago. Her smoking use included cigarettes. She has a 1.50 pack-year smoking history. She has never used smokeless tobacco. She reports that she does not drink alcohol and does not use drugs.    Family History: The patient's family history includes Arthritis in her mother and sister; COPD in her sister; Diabetes in her sister; Pancreatic cancer in her father.   ROS:  Please see the history of present illness. Otherwise, complete review of systems is positive for none.  All other systems are reviewed and negative.   Physical Exam: VS:  BP 120/68   Pulse 70   Ht 5\' 7"  (1.702 m)   Wt 232 lb 3.2 oz (105.3 kg)   SpO2 96%   BMI 36.37 kg/m , BMI Body mass index is 36.37 kg/m.  Wt Readings from Last 3 Encounters:  03/09/20 232 lb 3.2  oz (105.3 kg)  08/01/19 237 lb (107.5 kg)  01/31/19 225 lb (102.1 kg)    General: Patient appears comfortable at rest. Neck: Supple, no elevated JVP or carotid bruits, no thyromegaly. Lungs: Clear to auscultation, nonlabored breathing at rest. Cardiac: Regular rate and rhythm, no S3 or significant systolic murmur, no pericardial rub. Abdomen: Soft, nontender, no hepatomegaly, bowel sounds present, no guarding or rebound. Extremities: Mild none pitting edema left leg, distal pulses 2+. Skin: Warm and dry. Musculoskeletal: No kyphosis. Neuropsychiatric: Alert and oriented x3, affect grossly appropriate.  ECG:    Recent Labwork: No results found for requested labs within last 8760 hours.  No results found for: CHOL, TRIG, HDL, CHOLHDL, VLDL, LDLCALC, LDLDIRECT  Other Studies Reviewed Today:   Cardiac catheterization 08/25/2018:   Prox LAD lesion is 40% stenosed. = DFR 0.91 with an FFR 0.85.  Mid LAD lesion is 30% stenosed. Dist LAD lesion is 50% stenosed.  Otherwise angiographically normal coronary arteries with a left dominant system.  Normal LVEDP  SUMMARY  Angiographically minimal very proximal LAD lesion that surprisingly was borderline by DFR and moderate stenosis by FFR (0.85 --it is possible that FFR is considered from the distal vessel that this would have been an positive if measured at the apex) --> not physiologically  significant by FFR therefore no PCI  Otherwise minimal CAD with left dominant system.  Normal LVEDP   RECOMMENDATIONS  Discharge home after bed rest per PCI protocol based on extra heparin given for FFR  Follow-up with Dr. Purvis Sheffield.  Recommend aggressive risk factor modification and consider treatment with antispasm medications as this lesion would be prone to spasm    Echocardiogram 08/19/2018:  1. The left ventricle has normal systolic function with an ejection fraction of 60-65%. The cavity size was normal. Left ventricular diastolic Doppler parameters are consistent with impaired relaxation. Indeterminate filling pressures. 2. The right ventricle has normal systolic function. The cavity was normal. There is no increase in right ventricular wall thickness. 3. The mitral valve is grossly normal. 4. The tricuspid valve is grossly normal. 5. The aortic valve was not well visualized. Aortic valve regurgitation is mild by color flow Doppler. 6. The aorta is normal in size and structure. 7. The interatrial septum was not assessed. 8. Technically difficult study due to body habitus.  Assessment and Plan:   1. CAD in native artery No recent anginal or exertional symptoms other than occasional coronary vasospasm per her statement.  History of nonobstructive CAD on cardiac catheterization 08/25/2018.  Proximal LAD 40%, mid LAD 30%.  Distal LAD 50%.  Continue Imdur 30 mg daily.  Metoprolol tartrate 50 mg p.o. twice daily.  2. Essential hypertension Blood pressure well controlled today at 120/68.  Continue metoprolol 50 mg p.o. twice daily.  Lasix 40 mg daily.  Continue Lasix 40 mg daily.  3. Hyperlipidemia LDL goal <70 Total cholesterol 158, triglycerides 130, HDL 47, LDL 83 on June 21, 2018. Continue simvastatin 40 mg daily.  4. Diabetes mellitus type 2, insulin dependent (HCC) States her most recent hemoglobin A1c was 6.7%..  Managed by PCP on Metformin and  Lantus.  Medication Adjustments/Labs and Tests Ordered: Current medicines are reviewed at length with the patient today.  Concerns regarding medicines are outlined above.   Disposition: Follow-up with Dr. Wyline Mood or APP 6 months  Signed, Rennis Harding, NP 03/09/2020 11:12 AM    [facility] [address] [place], [place], [place] [address] Phone: [phone]; Fax: [phone])  [phone] 

## 2020-03-09 ENCOUNTER — Encounter: Payer: Self-pay | Admitting: Family Medicine

## 2020-03-09 ENCOUNTER — Other Ambulatory Visit: Payer: Self-pay

## 2020-03-09 ENCOUNTER — Ambulatory Visit (INDEPENDENT_AMBULATORY_CARE_PROVIDER_SITE_OTHER): Payer: HMO | Admitting: Family Medicine

## 2020-03-09 VITALS — BP 120/68 | HR 70 | Ht 67.0 in | Wt 232.2 lb

## 2020-03-09 DIAGNOSIS — E119 Type 2 diabetes mellitus without complications: Secondary | ICD-10-CM

## 2020-03-09 DIAGNOSIS — I10 Essential (primary) hypertension: Secondary | ICD-10-CM

## 2020-03-09 DIAGNOSIS — E785 Hyperlipidemia, unspecified: Secondary | ICD-10-CM | POA: Diagnosis not present

## 2020-03-09 DIAGNOSIS — I2510 Atherosclerotic heart disease of native coronary artery without angina pectoris: Secondary | ICD-10-CM

## 2020-03-09 DIAGNOSIS — Z794 Long term (current) use of insulin: Secondary | ICD-10-CM | POA: Diagnosis not present

## 2020-03-09 NOTE — Patient Instructions (Signed)
Medication Instructions:  Continue all current medications.   Labwork: none  Testing/Procedures: none  Follow-Up: 6 months   Any Other Special Instructions Will Be Listed Below (If Applicable).   If you need a refill on your cardiac medications before your next appointment, please call your pharmacy.  

## 2020-03-19 DIAGNOSIS — Z7984 Long term (current) use of oral hypoglycemic drugs: Secondary | ICD-10-CM | POA: Diagnosis not present

## 2020-03-19 DIAGNOSIS — E1140 Type 2 diabetes mellitus with diabetic neuropathy, unspecified: Secondary | ICD-10-CM | POA: Diagnosis not present

## 2020-03-19 DIAGNOSIS — I10 Essential (primary) hypertension: Secondary | ICD-10-CM | POA: Diagnosis not present

## 2020-03-19 DIAGNOSIS — E039 Hypothyroidism, unspecified: Secondary | ICD-10-CM | POA: Diagnosis not present

## 2020-03-27 DIAGNOSIS — S62355A Nondisplaced fracture of shaft of fourth metacarpal bone, left hand, initial encounter for closed fracture: Secondary | ICD-10-CM | POA: Diagnosis not present

## 2020-03-27 DIAGNOSIS — M79642 Pain in left hand: Secondary | ICD-10-CM | POA: Diagnosis not present

## 2020-03-27 DIAGNOSIS — M79641 Pain in right hand: Secondary | ICD-10-CM | POA: Diagnosis not present

## 2020-03-27 DIAGNOSIS — M65341 Trigger finger, right ring finger: Secondary | ICD-10-CM | POA: Diagnosis not present

## 2020-04-17 DIAGNOSIS — M79642 Pain in left hand: Secondary | ICD-10-CM | POA: Diagnosis not present

## 2020-04-17 DIAGNOSIS — M79641 Pain in right hand: Secondary | ICD-10-CM | POA: Diagnosis not present

## 2020-04-17 DIAGNOSIS — S62355D Nondisplaced fracture of shaft of fourth metacarpal bone, left hand, subsequent encounter for fracture with routine healing: Secondary | ICD-10-CM | POA: Diagnosis not present

## 2020-04-17 DIAGNOSIS — M65341 Trigger finger, right ring finger: Secondary | ICD-10-CM | POA: Diagnosis not present

## 2020-04-17 DIAGNOSIS — S62355A Nondisplaced fracture of shaft of fourth metacarpal bone, left hand, initial encounter for closed fracture: Secondary | ICD-10-CM | POA: Diagnosis not present

## 2020-04-18 DIAGNOSIS — I10 Essential (primary) hypertension: Secondary | ICD-10-CM | POA: Diagnosis not present

## 2020-04-18 DIAGNOSIS — E039 Hypothyroidism, unspecified: Secondary | ICD-10-CM | POA: Diagnosis not present

## 2020-04-18 DIAGNOSIS — Z7984 Long term (current) use of oral hypoglycemic drugs: Secondary | ICD-10-CM | POA: Diagnosis not present

## 2020-04-18 DIAGNOSIS — E1140 Type 2 diabetes mellitus with diabetic neuropathy, unspecified: Secondary | ICD-10-CM | POA: Diagnosis not present

## 2020-05-10 DIAGNOSIS — M2042 Other hammer toe(s) (acquired), left foot: Secondary | ICD-10-CM | POA: Diagnosis not present

## 2020-05-10 DIAGNOSIS — G609 Hereditary and idiopathic neuropathy, unspecified: Secondary | ICD-10-CM | POA: Diagnosis not present

## 2020-05-19 DIAGNOSIS — I10 Essential (primary) hypertension: Secondary | ICD-10-CM | POA: Diagnosis not present

## 2020-05-19 DIAGNOSIS — E1140 Type 2 diabetes mellitus with diabetic neuropathy, unspecified: Secondary | ICD-10-CM | POA: Diagnosis not present

## 2020-05-19 DIAGNOSIS — Z7984 Long term (current) use of oral hypoglycemic drugs: Secondary | ICD-10-CM | POA: Diagnosis not present

## 2020-05-19 DIAGNOSIS — E039 Hypothyroidism, unspecified: Secondary | ICD-10-CM | POA: Diagnosis not present

## 2020-05-31 DIAGNOSIS — E039 Hypothyroidism, unspecified: Secondary | ICD-10-CM | POA: Diagnosis not present

## 2020-05-31 DIAGNOSIS — E7849 Other hyperlipidemia: Secondary | ICD-10-CM | POA: Diagnosis not present

## 2020-05-31 DIAGNOSIS — I10 Essential (primary) hypertension: Secondary | ICD-10-CM | POA: Diagnosis not present

## 2020-05-31 DIAGNOSIS — E7800 Pure hypercholesterolemia, unspecified: Secondary | ICD-10-CM | POA: Diagnosis not present

## 2020-05-31 DIAGNOSIS — E1140 Type 2 diabetes mellitus with diabetic neuropathy, unspecified: Secondary | ICD-10-CM | POA: Diagnosis not present

## 2020-05-31 DIAGNOSIS — E782 Mixed hyperlipidemia: Secondary | ICD-10-CM | POA: Diagnosis not present

## 2020-05-31 DIAGNOSIS — E7801 Familial hypercholesterolemia: Secondary | ICD-10-CM | POA: Diagnosis not present

## 2020-06-04 DIAGNOSIS — I10 Essential (primary) hypertension: Secondary | ICD-10-CM | POA: Diagnosis not present

## 2020-06-04 DIAGNOSIS — K219 Gastro-esophageal reflux disease without esophagitis: Secondary | ICD-10-CM | POA: Diagnosis not present

## 2020-06-04 DIAGNOSIS — M109 Gout, unspecified: Secondary | ICD-10-CM | POA: Diagnosis not present

## 2020-06-04 DIAGNOSIS — E039 Hypothyroidism, unspecified: Secondary | ICD-10-CM | POA: Diagnosis not present

## 2020-06-04 DIAGNOSIS — E7801 Familial hypercholesterolemia: Secondary | ICD-10-CM | POA: Diagnosis not present

## 2020-06-04 DIAGNOSIS — E1165 Type 2 diabetes mellitus with hyperglycemia: Secondary | ICD-10-CM | POA: Diagnosis not present

## 2020-06-04 DIAGNOSIS — R079 Chest pain, unspecified: Secondary | ICD-10-CM | POA: Diagnosis not present

## 2020-06-04 DIAGNOSIS — R609 Edema, unspecified: Secondary | ICD-10-CM | POA: Diagnosis not present

## 2020-06-14 DIAGNOSIS — R1310 Dysphagia, unspecified: Secondary | ICD-10-CM | POA: Diagnosis not present

## 2020-06-14 DIAGNOSIS — Z6837 Body mass index (BMI) 37.0-37.9, adult: Secondary | ICD-10-CM | POA: Diagnosis not present

## 2020-06-14 DIAGNOSIS — T783XXA Angioneurotic edema, initial encounter: Secondary | ICD-10-CM | POA: Diagnosis not present

## 2020-06-18 DIAGNOSIS — I10 Essential (primary) hypertension: Secondary | ICD-10-CM | POA: Diagnosis not present

## 2020-06-18 DIAGNOSIS — E1140 Type 2 diabetes mellitus with diabetic neuropathy, unspecified: Secondary | ICD-10-CM | POA: Diagnosis not present

## 2020-06-18 DIAGNOSIS — Z7984 Long term (current) use of oral hypoglycemic drugs: Secondary | ICD-10-CM | POA: Diagnosis not present

## 2020-06-18 DIAGNOSIS — E039 Hypothyroidism, unspecified: Secondary | ICD-10-CM | POA: Diagnosis not present

## 2020-06-22 IMAGING — CT CT ABD-PELV W/ CM
2 of 5 series · 16 of 46 positions shown, 18 images · IV contrast (Isovue)
Comparison: 11/24/2008

CLINICAL DATA: Epigastric and lower abdominal pain since mid
December 2017, small stools, some diarrhea, question diverticulitis,
past history of diverticulitis, diabetes mellitus, hypertension

EXAM:
CT ABDOMEN AND PELVIS WITH CONTRAST
TECHNIQUE: Multidetector CT imaging of the abdomen and pelvis was performed
using the standard protocol following bolus administration of
intravenous contrast. Sagittal and coronal MPR images reconstructed
from axial data set.
CONTRAST:  75mL OMNIPAQUE IOHEXOL 300 MG/ML SOLN IV. Dilute oral
contrast.

[Series 2: axial st · axial · 0.83mm/px · z∈[+832,+1312]mm · 13 of 108 slices shown, 15 images]
[im 6/108  soft-tissue]
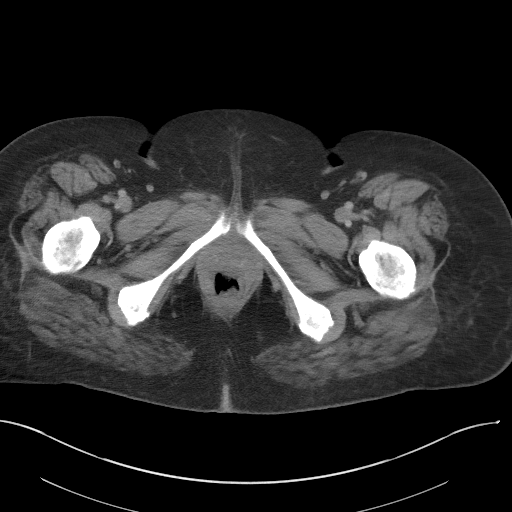
[im 6/108  bone]
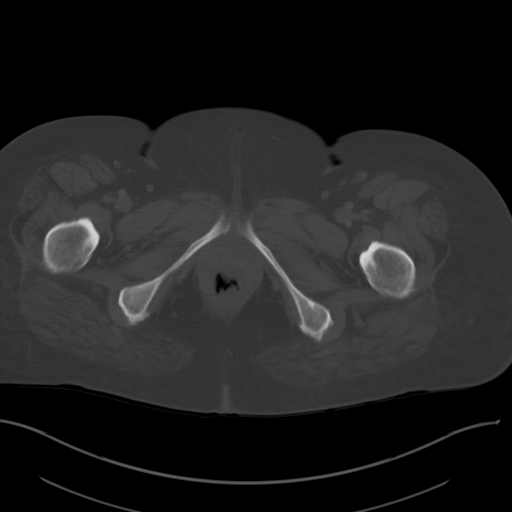
[im 12/108  soft-tissue]
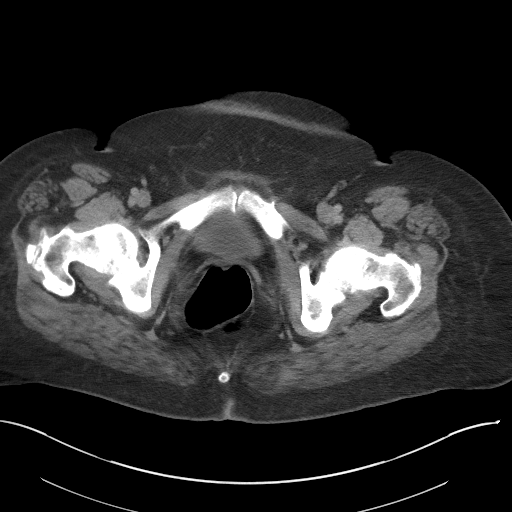
[im 24/108  soft-tissue]
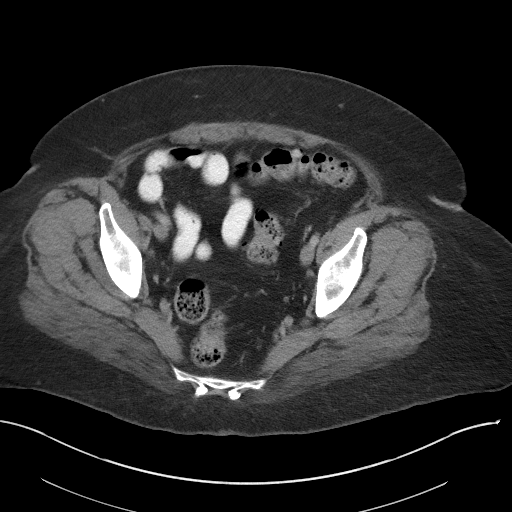
[im 30/108  soft-tissue]
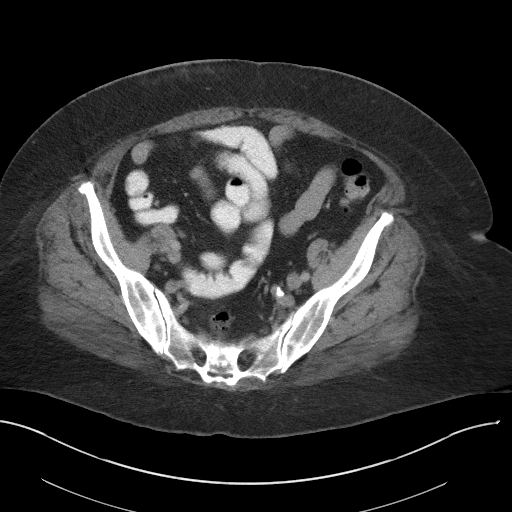
[im 36/108  soft-tissue]
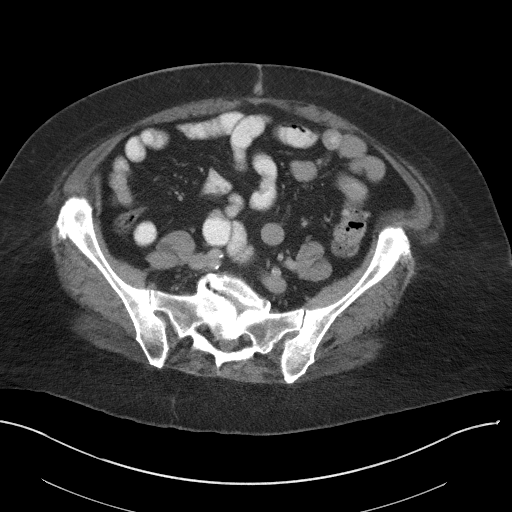
[im 48/108  soft-tissue]
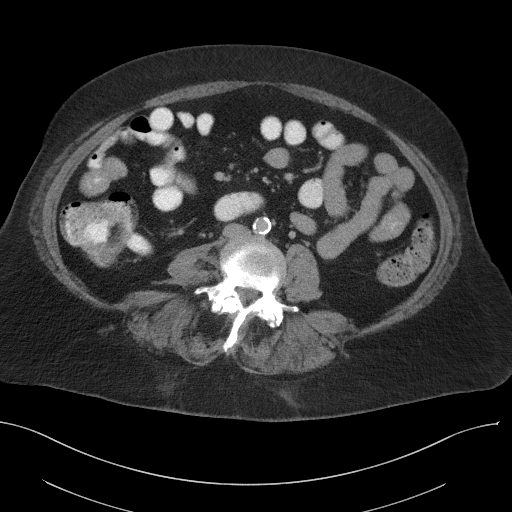
[im 54/108  soft-tissue]
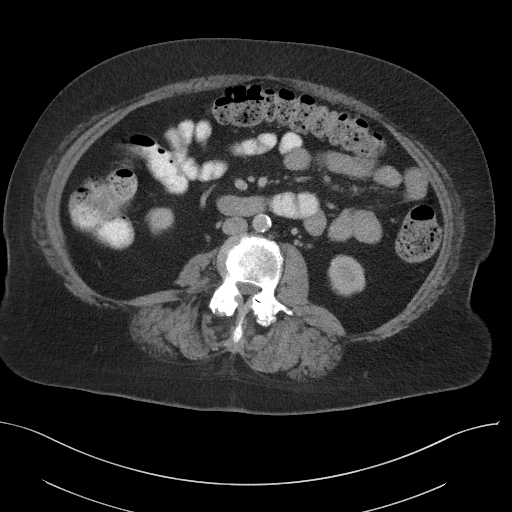
[im 60/108  soft-tissue]
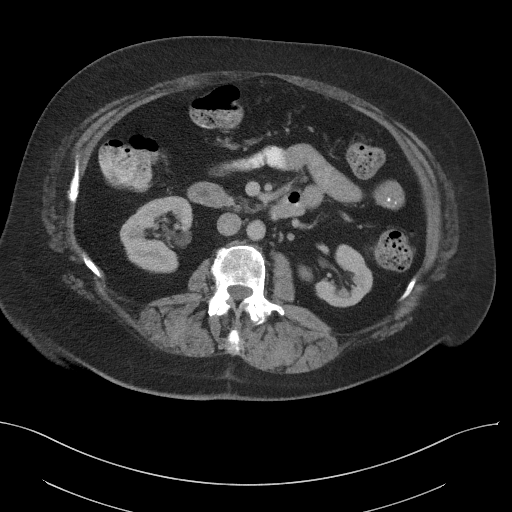
[im 72/108  soft-tissue]
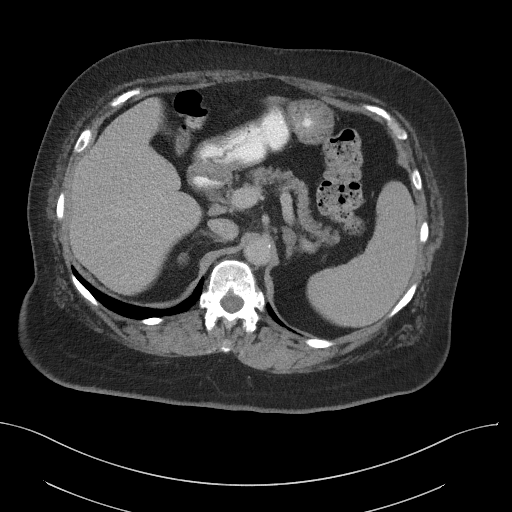
[im 72/108  bone]
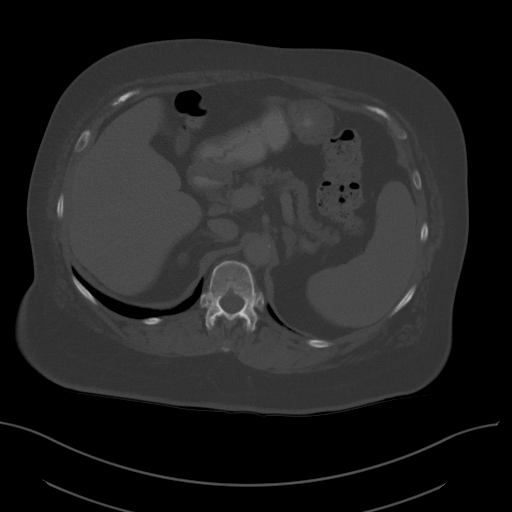
[im 78/108  soft-tissue]
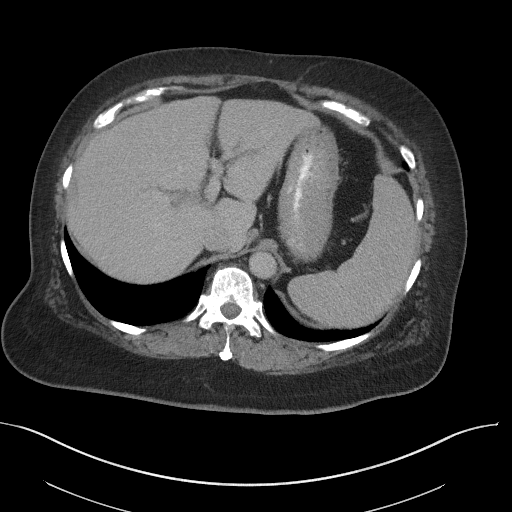
[im 84/108  soft-tissue]
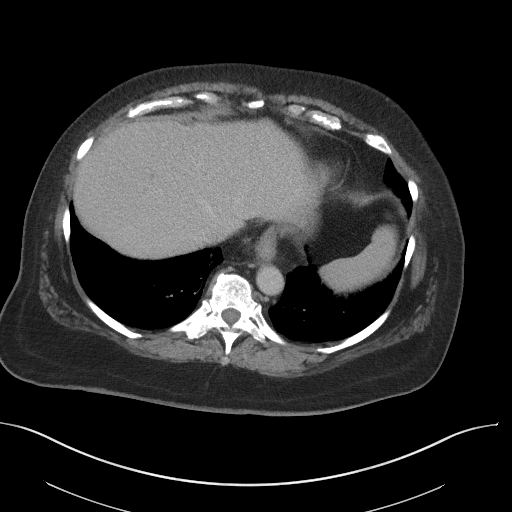
[im 96/108  soft-tissue]
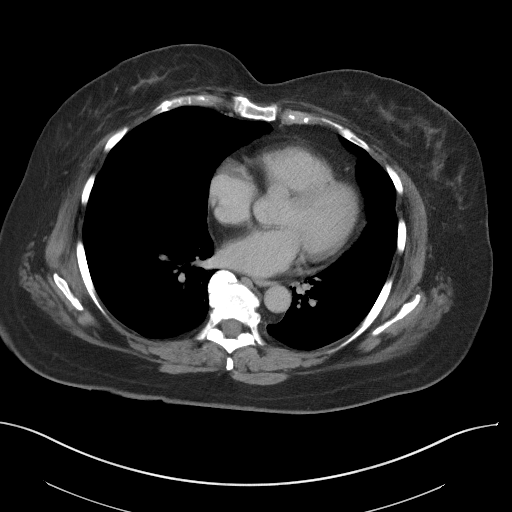
[im 102/108  soft-tissue]
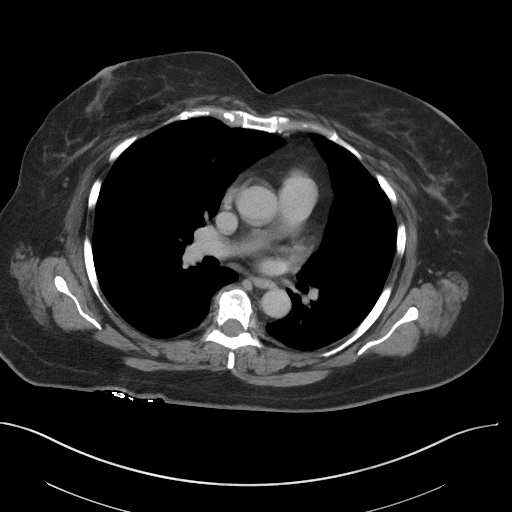

[Series 5: coronal st · coronal · 0.73mm/px · 3 of 100 slices shown]
[im 34/100  soft-tissue]
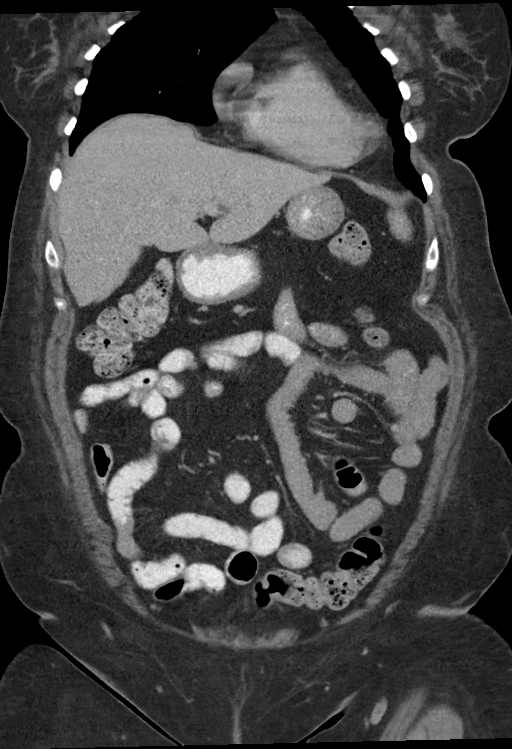
[im 45/100  soft-tissue]
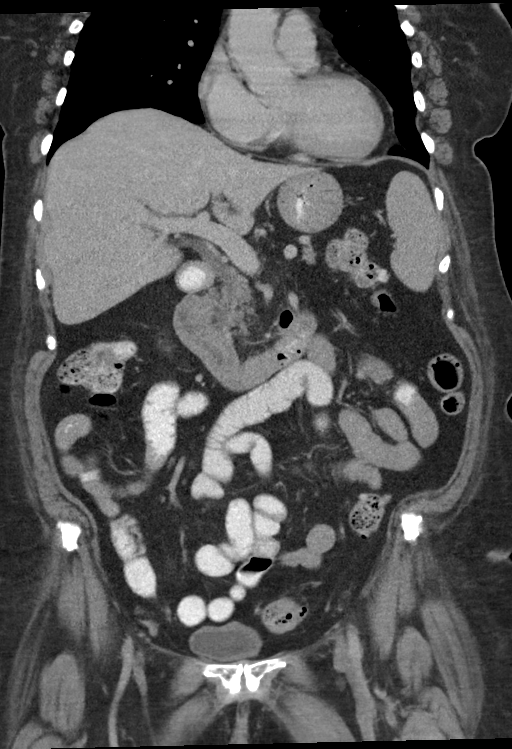
[im 56/100  soft-tissue]
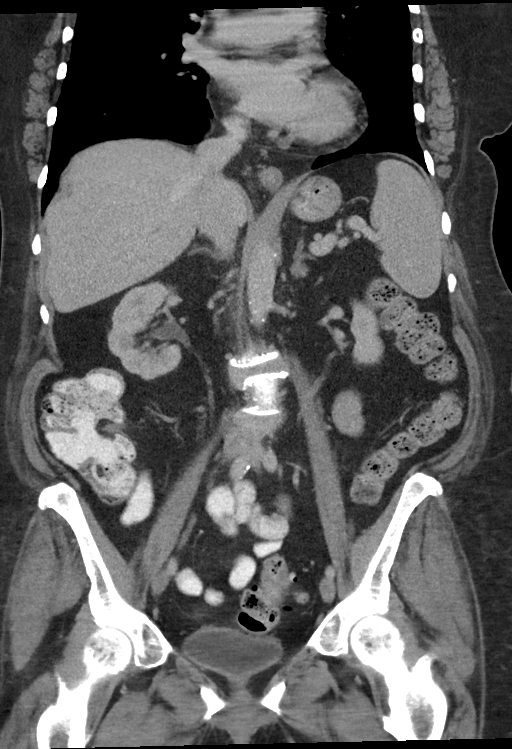

[16 of 46 positions shown; findings below may reference images not displayed]

FINDINGS: Lower chest: Minimal bibasilar atelectasis

Hepatobiliary: Gallbladder surgically absent. Liver unremarkable. No
significant biliary dilatation.

Pancreas: Normal appearance

Spleen: Upper normal size.  Otherwise normal appearance

Adrenals/Urinary Tract: BILATERAL adrenal nodules, RIGHT 16 x 12 mm,
14 x 10 mm LEFT, and and LEFT 16 x 15 mm, all grossly unchanged.
Kidneys, ureters, and bladder normal appearance

Stomach/Bowel: Normal appendix. Sigmoid diverticulosis without
evidence of diverticulitis. Stomach decompressed. Bowel loops
otherwise normal appearance.

Vascular/Lymphatic: Atherosclerotic calcifications aorta and iliac
arteries without aneurysm. No adenopathy.

Reproductive: Uterus surgically absent. Nonvisualization of ovaries.

Other: No free air or free fluid. No hernia or acute inflammatory
process.

Musculoskeletal: Chronic sclerosis at S1 segment of sacrum. No acute
osseous findings.
IMPRESSION: Stable BILATERAL adrenal nodules.

Sigmoid diverticulosis.

No acute intra-abdominal or intrapelvic abnormalities.

## 2020-07-02 DIAGNOSIS — Z20828 Contact with and (suspected) exposure to other viral communicable diseases: Secondary | ICD-10-CM | POA: Diagnosis not present

## 2020-07-02 DIAGNOSIS — J209 Acute bronchitis, unspecified: Secondary | ICD-10-CM | POA: Diagnosis not present

## 2020-07-02 DIAGNOSIS — R5383 Other fatigue: Secondary | ICD-10-CM | POA: Diagnosis not present

## 2020-07-02 DIAGNOSIS — Z20822 Contact with and (suspected) exposure to covid-19: Secondary | ICD-10-CM | POA: Diagnosis not present

## 2020-07-13 DIAGNOSIS — R1310 Dysphagia, unspecified: Secondary | ICD-10-CM | POA: Diagnosis not present

## 2020-07-19 DIAGNOSIS — E1140 Type 2 diabetes mellitus with diabetic neuropathy, unspecified: Secondary | ICD-10-CM | POA: Diagnosis not present

## 2020-07-19 DIAGNOSIS — E039 Hypothyroidism, unspecified: Secondary | ICD-10-CM | POA: Diagnosis not present

## 2020-07-19 DIAGNOSIS — I10 Essential (primary) hypertension: Secondary | ICD-10-CM | POA: Diagnosis not present

## 2020-07-19 DIAGNOSIS — Z7984 Long term (current) use of oral hypoglycemic drugs: Secondary | ICD-10-CM | POA: Diagnosis not present

## 2020-08-09 DIAGNOSIS — E1142 Type 2 diabetes mellitus with diabetic polyneuropathy: Secondary | ICD-10-CM | POA: Diagnosis not present

## 2020-08-09 DIAGNOSIS — B351 Tinea unguium: Secondary | ICD-10-CM | POA: Diagnosis not present

## 2020-08-09 DIAGNOSIS — M79676 Pain in unspecified toe(s): Secondary | ICD-10-CM | POA: Diagnosis not present

## 2020-08-09 DIAGNOSIS — L84 Corns and callosities: Secondary | ICD-10-CM | POA: Diagnosis not present

## 2020-08-19 DIAGNOSIS — Z7984 Long term (current) use of oral hypoglycemic drugs: Secondary | ICD-10-CM | POA: Diagnosis not present

## 2020-08-19 DIAGNOSIS — I10 Essential (primary) hypertension: Secondary | ICD-10-CM | POA: Diagnosis not present

## 2020-08-19 DIAGNOSIS — E1140 Type 2 diabetes mellitus with diabetic neuropathy, unspecified: Secondary | ICD-10-CM | POA: Diagnosis not present

## 2020-08-19 DIAGNOSIS — E039 Hypothyroidism, unspecified: Secondary | ICD-10-CM | POA: Diagnosis not present

## 2020-08-22 DIAGNOSIS — K1230 Oral mucositis (ulcerative), unspecified: Secondary | ICD-10-CM | POA: Diagnosis not present

## 2020-09-05 ENCOUNTER — Encounter: Payer: Self-pay | Admitting: Cardiology

## 2020-09-05 DIAGNOSIS — R5383 Other fatigue: Secondary | ICD-10-CM | POA: Diagnosis not present

## 2020-09-05 DIAGNOSIS — E782 Mixed hyperlipidemia: Secondary | ICD-10-CM | POA: Diagnosis not present

## 2020-09-05 DIAGNOSIS — E039 Hypothyroidism, unspecified: Secondary | ICD-10-CM | POA: Diagnosis not present

## 2020-09-05 DIAGNOSIS — M109 Gout, unspecified: Secondary | ICD-10-CM | POA: Diagnosis not present

## 2020-09-05 DIAGNOSIS — K219 Gastro-esophageal reflux disease without esophagitis: Secondary | ICD-10-CM | POA: Diagnosis not present

## 2020-09-05 DIAGNOSIS — E7849 Other hyperlipidemia: Secondary | ICD-10-CM | POA: Diagnosis not present

## 2020-09-05 DIAGNOSIS — I10 Essential (primary) hypertension: Secondary | ICD-10-CM | POA: Diagnosis not present

## 2020-09-05 DIAGNOSIS — E1140 Type 2 diabetes mellitus with diabetic neuropathy, unspecified: Secondary | ICD-10-CM | POA: Diagnosis not present

## 2020-09-07 DIAGNOSIS — E1165 Type 2 diabetes mellitus with hyperglycemia: Secondary | ICD-10-CM | POA: Diagnosis not present

## 2020-09-07 DIAGNOSIS — R609 Edema, unspecified: Secondary | ICD-10-CM | POA: Diagnosis not present

## 2020-09-07 DIAGNOSIS — I10 Essential (primary) hypertension: Secondary | ICD-10-CM | POA: Diagnosis not present

## 2020-09-07 DIAGNOSIS — R413 Other amnesia: Secondary | ICD-10-CM | POA: Diagnosis not present

## 2020-09-07 DIAGNOSIS — F411 Generalized anxiety disorder: Secondary | ICD-10-CM | POA: Diagnosis not present

## 2020-09-07 DIAGNOSIS — E7801 Familial hypercholesterolemia: Secondary | ICD-10-CM | POA: Diagnosis not present

## 2020-09-07 DIAGNOSIS — E039 Hypothyroidism, unspecified: Secondary | ICD-10-CM | POA: Diagnosis not present

## 2020-09-07 DIAGNOSIS — M109 Gout, unspecified: Secondary | ICD-10-CM | POA: Diagnosis not present

## 2020-09-12 ENCOUNTER — Encounter (INDEPENDENT_AMBULATORY_CARE_PROVIDER_SITE_OTHER): Payer: Self-pay | Admitting: *Deleted

## 2020-09-14 ENCOUNTER — Encounter: Payer: Self-pay | Admitting: Cardiology

## 2020-09-14 ENCOUNTER — Ambulatory Visit (INDEPENDENT_AMBULATORY_CARE_PROVIDER_SITE_OTHER): Payer: HMO | Admitting: Cardiology

## 2020-09-14 ENCOUNTER — Encounter: Payer: Self-pay | Admitting: *Deleted

## 2020-09-14 ENCOUNTER — Other Ambulatory Visit: Payer: Self-pay

## 2020-09-14 VITALS — BP 118/62 | HR 59 | Ht 67.0 in | Wt 226.6 lb

## 2020-09-14 DIAGNOSIS — I25119 Atherosclerotic heart disease of native coronary artery with unspecified angina pectoris: Secondary | ICD-10-CM | POA: Diagnosis not present

## 2020-09-14 DIAGNOSIS — I10 Essential (primary) hypertension: Secondary | ICD-10-CM

## 2020-09-14 DIAGNOSIS — E782 Mixed hyperlipidemia: Secondary | ICD-10-CM | POA: Diagnosis not present

## 2020-09-14 NOTE — Progress Notes (Signed)
Her right   Cardiology Office Note  Date: 09/14/2020   ID: Melanie Melanie Mathews, Melanie Mathews November 24, 1945, MRN [id number]  PCP:  Royann Shivers, PA-C  Cardiologist:  Nona Dell, MD Electrophysiologist:  None   Chief Complaint  Patient presents with   Cardiac follow-up     History of Present Illness: Melanie Melanie Mathews Melanie Mathews is a 75 y.o. female former patient of Dr. Purvis Sheffield now presenting to establish follow-up with me.  I reviewed her records and updated the chart.  She was last seen in February by Mr. Vincenza Hews NP.  She is here for a routine visit.  Reports no active angina symptoms on current medical regimen which is outlined below.  She continues to follow with PCP, requesting interval lab work for review.  She states that she has trouble with her balance, also neuropathy and ankle edema.  Has falls occasionally.  She did not tolerate gabapentin and is now on Lyrica.  I personally reviewed her ECG today which shows sinus bradycardia.  Past Medical History:  Diagnosis Date   Anxiety    CAD (coronary artery disease)    Mild to moderate proximal LAD disease 2020   Essential hypertension    Hernia    Hypothyroidism    Type 2 diabetes mellitus (HCC)     Past Surgical History:  Procedure Laterality Date   ABDOMINAL HYSTERECTOMY     CERVICAL FUSION     CHOLECYSTECTOMY     COLONOSCOPY     COLONOSCOPY  07/03/2011   Procedure: COLONOSCOPY;  Surgeon: Malissa Hippo, MD;  [facility];  Service: Endoscopy;  Laterality: N/A;  1030   ESOPHAGEAL DILATION N/A 04/19/2015   Procedure: ESOPHAGEAL DILATION;  Surgeon: Malissa Hippo, MD;  [facility];  Service: Endoscopy;  Laterality: N/A;   ESOPHAGOGASTRODUODENOSCOPY N/A 04/19/2015   Procedure: ESOPHAGOGASTRODUODENOSCOPY (EGD);  Surgeon: Malissa Hippo, MD;  [facility];  Service: Endoscopy;  Laterality: N/A;  2:05   KNEE SURGERY     Arthroscopic   LEFT HEART CATH AND CORONARY ANGIOGRAPHY N/A 08/25/2018    Procedure: LEFT HEART CATH AND CORONARY ANGIOGRAPHY;  Surgeon: Marykay Lex, MD;  Location: [facility] INVASIVE CV LAB;  Service: Cardiovascular;  Laterality: N/A;   UPPER GASTROINTESTINAL ENDOSCOPY      Current Outpatient Medications  Medication Sig Dispense Refill   acetaminophen (TYLENOL) 650 MG CR tablet Take 1,300 mg by mouth every 8 (eight) hours as needed for pain.     allopurinol (ZYLOPRIM) 300 MG tablet Take 300 mg by mouth daily.      Calcium Carb-Cholecalciferol (CALCIUM-VITAMIN D3) 500-400 MG-UNIT TABS Take 1 tablet by mouth daily.     colchicine 0.6 MG tablet Take 0.6 mg by mouth as needed.     DULoxetine (CYMBALTA) 60 MG capsule Take 60 mg by mouth 2 (two) times daily.      furosemide (LASIX) 40 MG tablet Take 40 mg by mouth daily.      insulin glargine (LANTUS) 100 UNIT/ML injection Inject 32 Units into the skin daily.     isosorbide mononitrate (IMDUR) 30 MG 24 hr tablet TAKE ONE TABLET BY MOUTH EVERY MORNING 90 tablet 3   ketoconazole (NIZORAL) 2 % cream Apply 1 application topically daily as needed (foot fungus).     levothyroxine (SYNTHROID) 150 MCG tablet Take 150 mcg by mouth daily before breakfast.     losartan (COZAAR) 50 MG tablet Take 50 mg by mouth every morning.     metFORMIN (GLUCOPHAGE)  500 MG tablet Take 1 tablet by mouth 2 (two) times a day.     metoprolol tartrate (LOPRESSOR) 50 MG tablet Take 50 mg by mouth 2 (two) times a day.      NEXIUM 40 MG capsule TAKE ONE CAPSULE ONCE DAILY 30 capsule 11   pregabalin (LYRICA) 50 MG capsule Take 50 mg by mouth 2 (two) times daily.     Probiotic Product (PROBIOTIC-10 PO) Take 1 tablet by mouth daily.     simvastatin (ZOCOR) 40 MG tablet Take 40 mg by mouth at bedtime.      traMADol (ULTRAM) 50 MG tablet Take 50 mg by mouth twice daily, may take a third 50 mg dose mid day as needed for pain     Turmeric 500 MG CAPS Take 500 mg by mouth 2 (two) times daily.     Zinc Sulfate (ZINC 15 PO) Take 15 mg by mouth daily.     Current  Facility-Administered Medications  Medication Dose Route Frequency Provider Last Rate Last Admin   sodium chloride flush (NS) 0.9 % injection 3 mL  3 mL Intravenous Q12H Laqueta Linden, MD       Allergies:  Elemental sulfur and Adhesive [tape]   ROS: No orthopnea or PND.  Physical Exam: VS:  BP 118/62   Pulse (!) 59   Ht 5\' 7"  (1.702 m)   Wt 226 lb 9.6 oz (102.8 kg)   SpO2 98%   BMI 35.49 kg/m , BMI Body mass index is 35.49 kg/m.  Wt Readings from Last 3 Encounters:  09/14/20 226 lb 9.6 oz (102.8 kg)  03/09/20 232 lb 3.2 oz (105.3 kg)  08/01/19 237 lb (107.5 kg)    General: Patient appears comfortable at rest. HEENT: Conjunctiva and lids normal, wearing a mask. Neck: Supple, no elevated JVP or carotid bruits, no thyromegaly. Lungs: Clear to auscultation, nonlabored breathing at rest. Cardiac: Regular rate and rhythm, no S3 or significant systolic murmur, no pericardial rub. Extremities: No pitting edema.  ECG:  An ECG dated 08/25/2018 was personally reviewed today and demonstrated:  Sinus rhythm with low voltage.  Recent Labwork:  June 2020: Cholesterol 158, triglycerides 139, HDL 47, LDL 83  Other Studies Reviewed Today:  Echocardiogram 08/19/2018:  1. The left ventricle has normal systolic function with an ejection  fraction of 60-65%. The cavity size was normal. Left ventricular diastolic  Doppler parameters are consistent with impaired relaxation. Indeterminate  filling pressures.   2. The right ventricle has normal systolic function. The cavity was  normal. There is no increase in right ventricular wall thickness.   3. The mitral valve is grossly normal.   4. The tricuspid valve is grossly normal.   5. The aortic valve was not well visualized. Aortic valve regurgitation  is mild by color flow Doppler.   6. The aorta is normal in size and structure.   7. The interatrial septum was not assessed.   8. Technically difficult study due to body habitus.   Cardiac  catheterization 08/25/2018: Prox LAD lesion is 40% stenosed. = DFR 0.91 with an FFR 0.85. Mid LAD lesion is 30% stenosed. Dist LAD lesion is 50% stenosed. Otherwise angiographically normal coronary arteries with a left dominant system. Normal LVEDP   SUMMARY Angiographically minimal very proximal LAD lesion that surprisingly was borderline by DFR and moderate stenosis by FFR (0.85 --it is possible that FFR is considered from the distal vessel that this would have been an positive if measured at the apex) -->  not physiologically significant by FFR therefore no PCI Otherwise minimal CAD with left dominant system. Normal LVEDP  Assessment and Plan:  1.  Mild to moderate proximal LAD disease that is being managed medically.  No active angina symptoms at this time.  ECG reviewed and stable.  Continue losartan, Imdur, Lopressor, and Zocor.  2.  Essential hypertension, on Lopressor and losartan.  Blood pressure is well controlled today.  3.  Mixed hyperlipidemia, on Zocor.  Requesting interval lab work from PCP.  Medication Adjustments/Labs and Tests Ordered: Current medicines are reviewed at length with the patient today.  Concerns regarding medicines are outlined above.   Tests Ordered: Orders Placed This Encounter  Procedures   EKG 12-Lead     Medication Changes: No orders of the defined types were placed in this encounter.   Disposition:  Follow up  1 year.  Signed, Jonelle Sidle, MD, [facility] 09/14/2020 2:52 PM    [facility] at [facility] [address] [place], [place], [place] [address] Phone: [phone]; Fax: [phone]

## 2020-09-14 NOTE — Patient Instructions (Addendum)

## 2020-09-19 DIAGNOSIS — I10 Essential (primary) hypertension: Secondary | ICD-10-CM | POA: Diagnosis not present

## 2020-09-19 DIAGNOSIS — Z7984 Long term (current) use of oral hypoglycemic drugs: Secondary | ICD-10-CM | POA: Diagnosis not present

## 2020-09-19 DIAGNOSIS — E039 Hypothyroidism, unspecified: Secondary | ICD-10-CM | POA: Diagnosis not present

## 2020-09-19 DIAGNOSIS — E1140 Type 2 diabetes mellitus with diabetic neuropathy, unspecified: Secondary | ICD-10-CM | POA: Diagnosis not present

## 2020-10-19 DIAGNOSIS — Z7984 Long term (current) use of oral hypoglycemic drugs: Secondary | ICD-10-CM | POA: Diagnosis not present

## 2020-10-19 DIAGNOSIS — I10 Essential (primary) hypertension: Secondary | ICD-10-CM | POA: Diagnosis not present

## 2020-10-19 DIAGNOSIS — E039 Hypothyroidism, unspecified: Secondary | ICD-10-CM | POA: Diagnosis not present

## 2020-10-19 DIAGNOSIS — E1140 Type 2 diabetes mellitus with diabetic neuropathy, unspecified: Secondary | ICD-10-CM | POA: Diagnosis not present

## 2020-10-22 ENCOUNTER — Other Ambulatory Visit: Payer: Self-pay | Admitting: *Deleted

## 2020-10-22 MED ORDER — ISOSORBIDE MONONITRATE ER 30 MG PO TB24
30.0000 mg | ORAL_TABLET | Freq: Every morning | ORAL | 1 refills | Status: DC
Start: 2020-10-22 — End: 2021-04-19

## 2020-11-08 DIAGNOSIS — B351 Tinea unguium: Secondary | ICD-10-CM | POA: Diagnosis not present

## 2020-11-08 DIAGNOSIS — L84 Corns and callosities: Secondary | ICD-10-CM | POA: Diagnosis not present

## 2020-11-08 DIAGNOSIS — E1142 Type 2 diabetes mellitus with diabetic polyneuropathy: Secondary | ICD-10-CM | POA: Diagnosis not present

## 2020-11-08 DIAGNOSIS — M79676 Pain in unspecified toe(s): Secondary | ICD-10-CM | POA: Diagnosis not present

## 2020-11-15 DIAGNOSIS — H524 Presbyopia: Secondary | ICD-10-CM | POA: Diagnosis not present

## 2020-11-15 DIAGNOSIS — H43813 Vitreous degeneration, bilateral: Secondary | ICD-10-CM | POA: Diagnosis not present

## 2020-11-19 DIAGNOSIS — E1140 Type 2 diabetes mellitus with diabetic neuropathy, unspecified: Secondary | ICD-10-CM | POA: Diagnosis not present

## 2020-11-19 DIAGNOSIS — Z7984 Long term (current) use of oral hypoglycemic drugs: Secondary | ICD-10-CM | POA: Diagnosis not present

## 2020-11-19 DIAGNOSIS — I10 Essential (primary) hypertension: Secondary | ICD-10-CM | POA: Diagnosis not present

## 2020-11-19 DIAGNOSIS — E039 Hypothyroidism, unspecified: Secondary | ICD-10-CM | POA: Diagnosis not present

## 2020-11-27 ENCOUNTER — Ambulatory Visit (INDEPENDENT_AMBULATORY_CARE_PROVIDER_SITE_OTHER): Payer: HMO | Admitting: Internal Medicine

## 2020-11-27 ENCOUNTER — Encounter (INDEPENDENT_AMBULATORY_CARE_PROVIDER_SITE_OTHER): Payer: Self-pay | Admitting: Internal Medicine

## 2020-11-27 ENCOUNTER — Other Ambulatory Visit: Payer: Self-pay

## 2020-11-27 VITALS — BP 118/65 | HR 70 | Temp 98.6°F | Ht 67.0 in | Wt 219.9 lb

## 2020-11-27 DIAGNOSIS — K219 Gastro-esophageal reflux disease without esophagitis: Secondary | ICD-10-CM

## 2020-11-27 DIAGNOSIS — R1319 Other dysphagia: Secondary | ICD-10-CM | POA: Diagnosis not present

## 2020-11-27 MED ORDER — ESOMEPRAZOLE MAGNESIUM 40 MG PO CPDR: 40.0000 mg | DELAYED_RELEASE_CAPSULE | Freq: Two times a day (BID) | ORAL | 3 refills | Status: DC

## 2020-11-27 NOTE — Patient Instructions (Signed)
Physician will call with results of barium esophagogram and further recommendations 

## 2020-11-27 NOTE — Progress Notes (Signed)
Presenting complaint;  Dysphagia.  Database and subjective:  Patient is 75 year old Caucasian female who has chronic GERD and history of dysphagia who is here for scheduled visit.  She was last seen for epigastric pain in January 2020. Patient presents with at least 6 months history of dysphagia to solids as well as liquids.  She has most difficulty with bread.  She points to upper sternal area as site of bolus obstruction.  Food bolus eventually passes down.  She has never had an episode of impaction relieved with regurgitation.  She also complains of hoarseness and feels lump in her throat.  She has had neck surgery.  She says one of the screws broke loose and she wonders if it has anything to do with her symptoms.  She also feels as if her neck is swollen.  She does not have difficulty breathing.  She was evaluated by speech therapist at [facility] and evaluation was negative.  Patient says she was able to swallow much better for several months following esophageal dilation and March 2017. She rarely has heartburn but she has daily hoarseness.  She is watching her diet.  She has lost 7 pounds since her last visit of January 2020.  He does not believe weight loss is because of her dysphagia.  She is trying to do so by watching her diet and she has eliminated carbonated drinks. Her bowels move every third day.  She denies melena or rectal bleeding. She is not able to do much walking because of sciatica and right leg pain.   Current Medications: Outpatient Encounter Medications as of 11/27/2020  Medication Sig   acetaminophen (TYLENOL) 650 MG CR tablet Take 1,300 mg by mouth every 8 (eight) hours as needed for pain.   allopurinol (ZYLOPRIM) 300 MG tablet Take 300 mg by mouth daily.    colchicine 0.6 MG tablet Take 0.6 mg by mouth as needed.   DULoxetine (CYMBALTA) 60 MG capsule Take 60 mg by mouth 2 (two) times daily.    furosemide (LASIX) 40 MG tablet Take 40 mg by mouth daily.    insulin  glargine (LANTUS) 100 UNIT/ML injection Inject 32 Units into the skin daily.   isosorbide mononitrate (IMDUR) 30 MG 24 hr tablet Take 1 tablet (30 mg total) by mouth every morning.   levothyroxine (SYNTHROID) 150 MCG tablet Take 150 mcg by mouth daily before breakfast.   losartan (COZAAR) 50 MG tablet Take 50 mg by mouth every morning.   metFORMIN (GLUCOPHAGE) 500 MG tablet Take 1 tablet by mouth 2 (two) times a day.   metoprolol tartrate (LOPRESSOR) 50 MG tablet Take 50 mg by mouth 2 (two) times a day.    NEXIUM 40 MG capsule TAKE ONE CAPSULE ONCE DAILY   pregabalin (LYRICA) 50 MG capsule Take 50 mg by mouth 2 (two) times daily.   Probiotic Product (PROBIOTIC-10 PO) Take 1 tablet by mouth daily.   simvastatin (ZOCOR) 40 MG tablet Take 40 mg by mouth at bedtime.    traMADol (ULTRAM) 50 MG tablet Take 50 mg by mouth twice daily, may take a third 50 mg dose mid day as needed for pain   Turmeric 500 MG CAPS Take 500 mg by mouth 2 (two) times daily.   Zinc Sulfate (ZINC 15 PO) Take 15 mg by mouth daily.   Calcium Carb-Cholecalciferol (CALCIUM-VITAMIN D3) 500-400 MG-UNIT TABS Take 1 tablet by mouth daily.   [DISCONTINUED] ketoconazole (NIZORAL) 2 % cream Apply 1 application topically daily as needed (foot fungus).  Facility-Administered Encounter Medications as of 11/27/2020  Medication   sodium chloride flush (NS) 0.9 % injection 3 mL     Objective: Blood pressure 118/65, pulse 70, temperature 98.6 F (37 C), temperature source Oral, height 5\' 7"  (1.702 m), weight 219 lb 14.4 oz (99.7 kg). Patient is alert and in no acute distress. Conjunctiva is pink. Sclera is nonicteric Oropharyngeal mucosa is normal. Dentition in satisfactory condition. No neck masses or thyromegaly noted. Cardiac exam with regular rhythm normal S1 and S2. No murmur or gallop noted. Lungs are clear to auscultation. Abdomen is full.  On palpation is soft.  She has mild tenderness in left upper quadrant.  There is no  guarding.  No organomegaly or masses. No LE edema or clubbing noted.  Labs/studies Results:  Lab data from 09/05/2020  WBC 5.9 H&H 10.9 and 38.4 Platelet count 242K  Bilirubin 0.4, AP 55, AST 12, ALT 9, total protein 6.5 and albumin 4.3. Serum calcium 9.3. Glucose 111 BUN 17 and creatinine 0.92   Hemoglobin A1c 6.8 TSH 1.220  Assessment:  #1.  Esophageal dysphagia.  Last esophagogastroduodenoscopy with esophageal dilation was in March 2017.  No structural abnormality was noted to her esophagus.  Therefore I am concerned that she may have esophageal motility disorder.  Also need to rule out Zenker's diverticulum esophageal web or stricture. Will first evaluate her with barium pill study before considering EGD.  #2.  Chronic GERD.  Heartburn is well controlled but she is having frequent hoarseness fullness in her throat.  She will be treated with double dose PPI for 8 to 12 weeks and see if the symptoms improve.  #3.  Patient is average risk for CRC.  Last colonoscopy was in June 2013 revealing sigmoid diverticula and external hemorrhoids.  She will be due for screening colonoscopy in June 2023.    Plan:  Increase esomeprazole to 40 mg by mouth twice daily.  Patient instructed to take it 30 minutes before breakfast and evening meal daily. Barium pill esophagogram. Further recommendations to follow.  Addendum Patient scan lab studies were reviewed after she left the office She is having blood work by her PCP next week.  She will ask for serum iron TIBC and ferritin to be added to her blood work.

## 2020-12-03 DIAGNOSIS — I10 Essential (primary) hypertension: Secondary | ICD-10-CM | POA: Diagnosis not present

## 2020-12-03 DIAGNOSIS — R5383 Other fatigue: Secondary | ICD-10-CM | POA: Diagnosis not present

## 2020-12-03 DIAGNOSIS — D519 Vitamin B12 deficiency anemia, unspecified: Secondary | ICD-10-CM | POA: Diagnosis not present

## 2020-12-03 DIAGNOSIS — E7849 Other hyperlipidemia: Secondary | ICD-10-CM | POA: Diagnosis not present

## 2020-12-03 DIAGNOSIS — E039 Hypothyroidism, unspecified: Secondary | ICD-10-CM | POA: Diagnosis not present

## 2020-12-03 DIAGNOSIS — E1165 Type 2 diabetes mellitus with hyperglycemia: Secondary | ICD-10-CM | POA: Diagnosis not present

## 2020-12-03 DIAGNOSIS — M109 Gout, unspecified: Secondary | ICD-10-CM | POA: Diagnosis not present

## 2020-12-03 DIAGNOSIS — D529 Folate deficiency anemia, unspecified: Secondary | ICD-10-CM | POA: Diagnosis not present

## 2020-12-03 DIAGNOSIS — E782 Mixed hyperlipidemia: Secondary | ICD-10-CM | POA: Diagnosis not present

## 2020-12-03 DIAGNOSIS — K219 Gastro-esophageal reflux disease without esophagitis: Secondary | ICD-10-CM | POA: Diagnosis not present

## 2020-12-03 DIAGNOSIS — D649 Anemia, unspecified: Secondary | ICD-10-CM | POA: Diagnosis not present

## 2020-12-05 ENCOUNTER — Other Ambulatory Visit: Payer: Self-pay

## 2020-12-05 ENCOUNTER — Ambulatory Visit (HOSPITAL_COMMUNITY)
Admission: RE | Admit: 2020-12-05 | Discharge: 2020-12-05 | Disposition: A | Discharge status: Home / Self Care | Payer: HMO | Source: Ambulatory Visit | Attending: Internal Medicine | Admitting: Internal Medicine

## 2020-12-05 DIAGNOSIS — R1310 Dysphagia, unspecified: Secondary | ICD-10-CM | POA: Diagnosis not present

## 2020-12-05 DIAGNOSIS — R1319 Other dysphagia: Secondary | ICD-10-CM | POA: Insufficient documentation

## 2020-12-06 DIAGNOSIS — Z1331 Encounter for screening for depression: Secondary | ICD-10-CM | POA: Diagnosis not present

## 2020-12-06 DIAGNOSIS — Z1389 Encounter for screening for other disorder: Secondary | ICD-10-CM | POA: Diagnosis not present

## 2020-12-06 DIAGNOSIS — E7801 Familial hypercholesterolemia: Secondary | ICD-10-CM | POA: Diagnosis not present

## 2020-12-06 DIAGNOSIS — R609 Edema, unspecified: Secondary | ICD-10-CM | POA: Diagnosis not present

## 2020-12-06 DIAGNOSIS — I10 Essential (primary) hypertension: Secondary | ICD-10-CM | POA: Diagnosis not present

## 2020-12-06 DIAGNOSIS — E039 Hypothyroidism, unspecified: Secondary | ICD-10-CM | POA: Diagnosis not present

## 2020-12-06 DIAGNOSIS — H04123 Dry eye syndrome of bilateral lacrimal glands: Secondary | ICD-10-CM | POA: Diagnosis not present

## 2020-12-06 DIAGNOSIS — Z23 Encounter for immunization: Secondary | ICD-10-CM | POA: Diagnosis not present

## 2020-12-06 DIAGNOSIS — E1165 Type 2 diabetes mellitus with hyperglycemia: Secondary | ICD-10-CM | POA: Diagnosis not present

## 2020-12-19 DIAGNOSIS — E039 Hypothyroidism, unspecified: Secondary | ICD-10-CM | POA: Diagnosis not present

## 2020-12-19 DIAGNOSIS — E1140 Type 2 diabetes mellitus with diabetic neuropathy, unspecified: Secondary | ICD-10-CM | POA: Diagnosis not present

## 2020-12-19 DIAGNOSIS — I10 Essential (primary) hypertension: Secondary | ICD-10-CM | POA: Diagnosis not present

## 2020-12-19 DIAGNOSIS — Z7984 Long term (current) use of oral hypoglycemic drugs: Secondary | ICD-10-CM | POA: Diagnosis not present

## 2021-01-10 DIAGNOSIS — J209 Acute bronchitis, unspecified: Secondary | ICD-10-CM | POA: Diagnosis not present

## 2021-01-10 DIAGNOSIS — J019 Acute sinusitis, unspecified: Secondary | ICD-10-CM | POA: Diagnosis not present

## 2021-01-10 DIAGNOSIS — Z20828 Contact with and (suspected) exposure to other viral communicable diseases: Secondary | ICD-10-CM | POA: Diagnosis not present

## 2021-01-18 DIAGNOSIS — Z7984 Long term (current) use of oral hypoglycemic drugs: Secondary | ICD-10-CM | POA: Diagnosis not present

## 2021-01-18 DIAGNOSIS — I10 Essential (primary) hypertension: Secondary | ICD-10-CM | POA: Diagnosis not present

## 2021-01-18 DIAGNOSIS — E1140 Type 2 diabetes mellitus with diabetic neuropathy, unspecified: Secondary | ICD-10-CM | POA: Diagnosis not present

## 2021-01-18 DIAGNOSIS — E039 Hypothyroidism, unspecified: Secondary | ICD-10-CM | POA: Diagnosis not present

## 2021-01-22 ENCOUNTER — Ambulatory Visit (INDEPENDENT_AMBULATORY_CARE_PROVIDER_SITE_OTHER): Payer: HMO | Admitting: Internal Medicine

## 2021-01-23 DIAGNOSIS — Z6834 Body mass index (BMI) 34.0-34.9, adult: Secondary | ICD-10-CM | POA: Diagnosis not present

## 2021-01-23 DIAGNOSIS — E119 Type 2 diabetes mellitus without complications: Secondary | ICD-10-CM | POA: Diagnosis not present

## 2021-02-07 DIAGNOSIS — E1142 Type 2 diabetes mellitus with diabetic polyneuropathy: Secondary | ICD-10-CM | POA: Diagnosis not present

## 2021-02-07 DIAGNOSIS — M79676 Pain in unspecified toe(s): Secondary | ICD-10-CM | POA: Diagnosis not present

## 2021-02-07 DIAGNOSIS — L84 Corns and callosities: Secondary | ICD-10-CM | POA: Diagnosis not present

## 2021-02-07 DIAGNOSIS — B351 Tinea unguium: Secondary | ICD-10-CM | POA: Diagnosis not present

## 2021-02-17 DIAGNOSIS — E1140 Type 2 diabetes mellitus with diabetic neuropathy, unspecified: Secondary | ICD-10-CM | POA: Diagnosis not present

## 2021-02-17 DIAGNOSIS — I10 Essential (primary) hypertension: Secondary | ICD-10-CM | POA: Diagnosis not present

## 2021-03-11 DIAGNOSIS — I10 Essential (primary) hypertension: Secondary | ICD-10-CM | POA: Diagnosis not present

## 2021-03-11 DIAGNOSIS — E1140 Type 2 diabetes mellitus with diabetic neuropathy, unspecified: Secondary | ICD-10-CM | POA: Diagnosis not present

## 2021-03-11 DIAGNOSIS — E039 Hypothyroidism, unspecified: Secondary | ICD-10-CM | POA: Diagnosis not present

## 2021-03-11 DIAGNOSIS — E7801 Familial hypercholesterolemia: Secondary | ICD-10-CM | POA: Diagnosis not present

## 2021-03-11 DIAGNOSIS — E1165 Type 2 diabetes mellitus with hyperglycemia: Secondary | ICD-10-CM | POA: Diagnosis not present

## 2021-03-11 DIAGNOSIS — E7800 Pure hypercholesterolemia, unspecified: Secondary | ICD-10-CM | POA: Diagnosis not present

## 2021-03-11 DIAGNOSIS — M109 Gout, unspecified: Secondary | ICD-10-CM | POA: Diagnosis not present

## 2021-03-11 DIAGNOSIS — E7849 Other hyperlipidemia: Secondary | ICD-10-CM | POA: Diagnosis not present

## 2021-03-11 DIAGNOSIS — E782 Mixed hyperlipidemia: Secondary | ICD-10-CM | POA: Diagnosis not present

## 2021-03-19 DIAGNOSIS — I10 Essential (primary) hypertension: Secondary | ICD-10-CM | POA: Diagnosis not present

## 2021-03-19 DIAGNOSIS — E1140 Type 2 diabetes mellitus with diabetic neuropathy, unspecified: Secondary | ICD-10-CM | POA: Diagnosis not present

## 2021-03-20 DIAGNOSIS — E039 Hypothyroidism, unspecified: Secondary | ICD-10-CM | POA: Diagnosis not present

## 2021-03-20 DIAGNOSIS — K219 Gastro-esophageal reflux disease without esophagitis: Secondary | ICD-10-CM | POA: Diagnosis not present

## 2021-03-20 DIAGNOSIS — M545 Low back pain, unspecified: Secondary | ICD-10-CM | POA: Diagnosis not present

## 2021-03-20 DIAGNOSIS — R609 Edema, unspecified: Secondary | ICD-10-CM | POA: Diagnosis not present

## 2021-03-20 DIAGNOSIS — E7801 Familial hypercholesterolemia: Secondary | ICD-10-CM | POA: Diagnosis not present

## 2021-03-20 DIAGNOSIS — E1165 Type 2 diabetes mellitus with hyperglycemia: Secondary | ICD-10-CM | POA: Diagnosis not present

## 2021-03-20 DIAGNOSIS — G6289 Other specified polyneuropathies: Secondary | ICD-10-CM | POA: Diagnosis not present

## 2021-03-20 DIAGNOSIS — Z23 Encounter for immunization: Secondary | ICD-10-CM | POA: Diagnosis not present

## 2021-03-20 DIAGNOSIS — F411 Generalized anxiety disorder: Secondary | ICD-10-CM | POA: Diagnosis not present

## 2021-03-20 DIAGNOSIS — I10 Essential (primary) hypertension: Secondary | ICD-10-CM | POA: Diagnosis not present

## 2021-03-20 DIAGNOSIS — E7800 Pure hypercholesterolemia, unspecified: Secondary | ICD-10-CM | POA: Diagnosis not present

## 2021-03-20 DIAGNOSIS — Z6832 Body mass index (BMI) 32.0-32.9, adult: Secondary | ICD-10-CM | POA: Diagnosis not present

## 2021-03-20 DIAGNOSIS — R413 Other amnesia: Secondary | ICD-10-CM | POA: Diagnosis not present

## 2021-03-20 DIAGNOSIS — M109 Gout, unspecified: Secondary | ICD-10-CM | POA: Diagnosis not present

## 2021-03-30 DIAGNOSIS — Z6832 Body mass index (BMI) 32.0-32.9, adult: Secondary | ICD-10-CM | POA: Diagnosis not present

## 2021-03-30 DIAGNOSIS — M5431 Sciatica, right side: Secondary | ICD-10-CM | POA: Diagnosis not present

## 2021-03-30 DIAGNOSIS — M7061 Trochanteric bursitis, right hip: Secondary | ICD-10-CM | POA: Diagnosis not present

## 2021-04-05 DIAGNOSIS — Z6832 Body mass index (BMI) 32.0-32.9, adult: Secondary | ICD-10-CM | POA: Diagnosis not present

## 2021-04-05 DIAGNOSIS — M25551 Pain in right hip: Secondary | ICD-10-CM | POA: Diagnosis not present

## 2021-04-05 DIAGNOSIS — M7061 Trochanteric bursitis, right hip: Secondary | ICD-10-CM | POA: Diagnosis not present

## 2021-04-11 ENCOUNTER — Other Ambulatory Visit (INDEPENDENT_AMBULATORY_CARE_PROVIDER_SITE_OTHER): Payer: Self-pay | Admitting: Internal Medicine

## 2021-04-19 ENCOUNTER — Other Ambulatory Visit: Payer: Self-pay | Admitting: *Deleted

## 2021-04-19 DIAGNOSIS — D649 Anemia, unspecified: Secondary | ICD-10-CM | POA: Diagnosis not present

## 2021-04-19 DIAGNOSIS — I10 Essential (primary) hypertension: Secondary | ICD-10-CM | POA: Diagnosis not present

## 2021-04-19 DIAGNOSIS — E1140 Type 2 diabetes mellitus with diabetic neuropathy, unspecified: Secondary | ICD-10-CM | POA: Diagnosis not present

## 2021-04-19 DIAGNOSIS — E039 Hypothyroidism, unspecified: Secondary | ICD-10-CM | POA: Diagnosis not present

## 2021-04-19 MED ORDER — ISOSORBIDE MONONITRATE ER 30 MG PO TB24: 30.0000 mg | ORAL_TABLET | Freq: Every morning | ORAL | 1 refills | Status: DC

## 2021-05-09 DIAGNOSIS — B351 Tinea unguium: Secondary | ICD-10-CM | POA: Diagnosis not present

## 2021-05-09 DIAGNOSIS — M79676 Pain in unspecified toe(s): Secondary | ICD-10-CM | POA: Diagnosis not present

## 2021-05-09 DIAGNOSIS — E1142 Type 2 diabetes mellitus with diabetic polyneuropathy: Secondary | ICD-10-CM | POA: Diagnosis not present

## 2021-05-09 DIAGNOSIS — L84 Corns and callosities: Secondary | ICD-10-CM | POA: Diagnosis not present

## 2021-05-14 DIAGNOSIS — D649 Anemia, unspecified: Secondary | ICD-10-CM | POA: Diagnosis not present

## 2021-05-19 DIAGNOSIS — Z7984 Long term (current) use of oral hypoglycemic drugs: Secondary | ICD-10-CM | POA: Diagnosis not present

## 2021-05-19 DIAGNOSIS — E039 Hypothyroidism, unspecified: Secondary | ICD-10-CM | POA: Diagnosis not present

## 2021-05-19 DIAGNOSIS — I10 Essential (primary) hypertension: Secondary | ICD-10-CM | POA: Diagnosis not present

## 2021-05-19 DIAGNOSIS — E1140 Type 2 diabetes mellitus with diabetic neuropathy, unspecified: Secondary | ICD-10-CM | POA: Diagnosis not present

## 2021-05-20 DIAGNOSIS — E7801 Familial hypercholesterolemia: Secondary | ICD-10-CM | POA: Diagnosis not present

## 2021-05-20 DIAGNOSIS — R609 Edema, unspecified: Secondary | ICD-10-CM | POA: Diagnosis not present

## 2021-05-20 DIAGNOSIS — M109 Gout, unspecified: Secondary | ICD-10-CM | POA: Diagnosis not present

## 2021-05-20 DIAGNOSIS — I10 Essential (primary) hypertension: Secondary | ICD-10-CM | POA: Diagnosis not present

## 2021-05-20 DIAGNOSIS — R413 Other amnesia: Secondary | ICD-10-CM | POA: Diagnosis not present

## 2021-05-20 DIAGNOSIS — F411 Generalized anxiety disorder: Secondary | ICD-10-CM | POA: Diagnosis not present

## 2021-05-20 DIAGNOSIS — E1165 Type 2 diabetes mellitus with hyperglycemia: Secondary | ICD-10-CM | POA: Diagnosis not present

## 2021-05-20 DIAGNOSIS — E039 Hypothyroidism, unspecified: Secondary | ICD-10-CM | POA: Diagnosis not present

## 2021-06-03 ENCOUNTER — Other Ambulatory Visit (INDEPENDENT_AMBULATORY_CARE_PROVIDER_SITE_OTHER): Payer: Self-pay | Admitting: Internal Medicine

## 2021-06-11 ENCOUNTER — Encounter (INDEPENDENT_AMBULATORY_CARE_PROVIDER_SITE_OTHER): Payer: Self-pay | Admitting: *Deleted

## 2021-06-19 DIAGNOSIS — I10 Essential (primary) hypertension: Secondary | ICD-10-CM | POA: Diagnosis not present

## 2021-06-19 DIAGNOSIS — E1140 Type 2 diabetes mellitus with diabetic neuropathy, unspecified: Secondary | ICD-10-CM | POA: Diagnosis not present

## 2021-06-19 DIAGNOSIS — Z7984 Long term (current) use of oral hypoglycemic drugs: Secondary | ICD-10-CM | POA: Diagnosis not present

## 2021-06-19 DIAGNOSIS — E039 Hypothyroidism, unspecified: Secondary | ICD-10-CM | POA: Diagnosis not present

## 2021-08-06 DIAGNOSIS — Z6830 Body mass index (BMI) 30.0-30.9, adult: Secondary | ICD-10-CM | POA: Diagnosis not present

## 2021-08-06 DIAGNOSIS — R030 Elevated blood-pressure reading, without diagnosis of hypertension: Secondary | ICD-10-CM | POA: Diagnosis not present

## 2021-08-06 DIAGNOSIS — M25511 Pain in right shoulder: Secondary | ICD-10-CM | POA: Diagnosis not present

## 2021-08-06 DIAGNOSIS — M79601 Pain in right arm: Secondary | ICD-10-CM | POA: Diagnosis not present

## 2021-08-06 DIAGNOSIS — S50811A Abrasion of right forearm, initial encounter: Secondary | ICD-10-CM | POA: Diagnosis not present

## 2021-08-15 DIAGNOSIS — E1142 Type 2 diabetes mellitus with diabetic polyneuropathy: Secondary | ICD-10-CM | POA: Diagnosis not present

## 2021-08-15 DIAGNOSIS — L84 Corns and callosities: Secondary | ICD-10-CM | POA: Diagnosis not present

## 2021-08-15 DIAGNOSIS — B351 Tinea unguium: Secondary | ICD-10-CM | POA: Diagnosis not present

## 2021-08-15 DIAGNOSIS — M79676 Pain in unspecified toe(s): Secondary | ICD-10-CM | POA: Diagnosis not present

## 2021-08-16 DIAGNOSIS — E1140 Type 2 diabetes mellitus with diabetic neuropathy, unspecified: Secondary | ICD-10-CM | POA: Diagnosis not present

## 2021-08-16 DIAGNOSIS — E782 Mixed hyperlipidemia: Secondary | ICD-10-CM | POA: Diagnosis not present

## 2021-08-16 DIAGNOSIS — E1165 Type 2 diabetes mellitus with hyperglycemia: Secondary | ICD-10-CM | POA: Diagnosis not present

## 2021-08-16 DIAGNOSIS — E7849 Other hyperlipidemia: Secondary | ICD-10-CM | POA: Diagnosis not present

## 2021-08-16 DIAGNOSIS — E7801 Familial hypercholesterolemia: Secondary | ICD-10-CM | POA: Diagnosis not present

## 2021-08-16 DIAGNOSIS — E7800 Pure hypercholesterolemia, unspecified: Secondary | ICD-10-CM | POA: Diagnosis not present

## 2021-08-16 DIAGNOSIS — E039 Hypothyroidism, unspecified: Secondary | ICD-10-CM | POA: Diagnosis not present

## 2021-08-16 DIAGNOSIS — M109 Gout, unspecified: Secondary | ICD-10-CM | POA: Diagnosis not present

## 2021-08-16 DIAGNOSIS — K219 Gastro-esophageal reflux disease without esophagitis: Secondary | ICD-10-CM | POA: Diagnosis not present

## 2021-08-16 DIAGNOSIS — I10 Essential (primary) hypertension: Secondary | ICD-10-CM | POA: Diagnosis not present

## 2021-08-19 DIAGNOSIS — E039 Hypothyroidism, unspecified: Secondary | ICD-10-CM | POA: Diagnosis not present

## 2021-08-19 DIAGNOSIS — I10 Essential (primary) hypertension: Secondary | ICD-10-CM | POA: Diagnosis not present

## 2021-08-19 DIAGNOSIS — Z79890 Hormone replacement therapy (postmenopausal): Secondary | ICD-10-CM | POA: Diagnosis not present

## 2021-08-19 DIAGNOSIS — I2510 Atherosclerotic heart disease of native coronary artery without angina pectoris: Secondary | ICD-10-CM | POA: Diagnosis not present

## 2021-08-19 DIAGNOSIS — Z8679 Personal history of other diseases of the circulatory system: Secondary | ICD-10-CM | POA: Diagnosis not present

## 2021-08-19 DIAGNOSIS — Z7982 Long term (current) use of aspirin: Secondary | ICD-10-CM | POA: Diagnosis not present

## 2021-08-20 ENCOUNTER — Encounter (INDEPENDENT_AMBULATORY_CARE_PROVIDER_SITE_OTHER): Payer: Self-pay | Admitting: *Deleted

## 2021-08-20 DIAGNOSIS — R609 Edema, unspecified: Secondary | ICD-10-CM | POA: Diagnosis not present

## 2021-08-20 DIAGNOSIS — R413 Other amnesia: Secondary | ICD-10-CM | POA: Diagnosis not present

## 2021-08-20 DIAGNOSIS — I10 Essential (primary) hypertension: Secondary | ICD-10-CM | POA: Diagnosis not present

## 2021-08-20 DIAGNOSIS — M109 Gout, unspecified: Secondary | ICD-10-CM | POA: Diagnosis not present

## 2021-08-20 DIAGNOSIS — Z6831 Body mass index (BMI) 31.0-31.9, adult: Secondary | ICD-10-CM | POA: Diagnosis not present

## 2021-08-20 DIAGNOSIS — G6289 Other specified polyneuropathies: Secondary | ICD-10-CM | POA: Diagnosis not present

## 2021-08-20 DIAGNOSIS — E1165 Type 2 diabetes mellitus with hyperglycemia: Secondary | ICD-10-CM | POA: Diagnosis not present

## 2021-08-20 DIAGNOSIS — K219 Gastro-esophageal reflux disease without esophagitis: Secondary | ICD-10-CM | POA: Diagnosis not present

## 2021-08-20 DIAGNOSIS — M545 Low back pain, unspecified: Secondary | ICD-10-CM | POA: Diagnosis not present

## 2021-08-20 DIAGNOSIS — E039 Hypothyroidism, unspecified: Secondary | ICD-10-CM | POA: Diagnosis not present

## 2021-08-20 DIAGNOSIS — E7801 Familial hypercholesterolemia: Secondary | ICD-10-CM | POA: Diagnosis not present

## 2021-08-20 DIAGNOSIS — F411 Generalized anxiety disorder: Secondary | ICD-10-CM | POA: Diagnosis not present

## 2021-08-27 DIAGNOSIS — E039 Hypothyroidism, unspecified: Secondary | ICD-10-CM | POA: Diagnosis not present

## 2021-08-30 ENCOUNTER — Other Ambulatory Visit: Payer: Self-pay | Admitting: Cardiology

## 2021-09-09 ENCOUNTER — Other Ambulatory Visit (INDEPENDENT_AMBULATORY_CARE_PROVIDER_SITE_OTHER): Payer: Self-pay | Admitting: Internal Medicine

## 2021-09-10 NOTE — Telephone Encounter (Signed)
11/27/2020 last seen by Dr.Rehman

## 2021-09-16 ENCOUNTER — Encounter: Payer: Self-pay | Admitting: Medical

## 2021-09-16 ENCOUNTER — Ambulatory Visit: Payer: PPO | Attending: Medical | Admitting: Medical

## 2021-09-16 VITALS — BP 130/84 | HR 71 | Ht 67.0 in | Wt 195.0 lb

## 2021-09-16 DIAGNOSIS — I25119 Atherosclerotic heart disease of native coronary artery with unspecified angina pectoris: Secondary | ICD-10-CM | POA: Diagnosis not present

## 2021-09-16 DIAGNOSIS — I10 Essential (primary) hypertension: Secondary | ICD-10-CM

## 2021-09-16 DIAGNOSIS — E782 Mixed hyperlipidemia: Secondary | ICD-10-CM

## 2021-09-16 MED ORDER — ISOSORBIDE MONONITRATE ER 30 MG PO TB24: 30.0000 mg | ORAL_TABLET | Freq: Every morning | ORAL | 3 refills | Status: DC

## 2021-09-16 NOTE — Patient Instructions (Signed)
Medication Instructions:  Your physician recommends that you continue on your current medications as directed. Please refer to the Current Medication list given to you today.   Labwork: None  Testing/Procedures: None  Follow-Up: Follow up with Dr. McDowell in 1 year.   Any Other Special Instructions Will Be Listed Below (If Applicable).     If you need a refill on your cardiac medications before your next appointment, please call your pharmacy.  

## 2021-09-16 NOTE — Progress Notes (Signed)
Cardiology Office Note:    Date:  09/16/2021   ID:  Quetzal, Meany 03-13-1945, MRN [id number]  PCP:  Sheela Stack  [facility] Cardiologist:  Nona Dell, MD  [facility] Electrophysiologist:  None   Referring MD: Royann Shivers, *   Chief Complaint: 1 year follow-up  History of Present Illness:    Melanie Mathews is a 76 y.o. female with a hx of nonobstructive CAD in LAD on cath 2020, HTN, HLD, DM2 who presents for follow-up.   Heart cath in 2020 showed 40% stenosis proximal LAD with FFR 0.8.5, mid LAD lesion of 30%, 50% and distal LAD lesion, otherwise angiographically normal coronary arteries.  Normal LVEDP.  Echo 08/09/2018 showed LVEF 60 to 65%, impaired relaxation.  Last seen 09/14/2020 and was stable from a cardiac perspective.  Today, the patient reports she is overall doing well. She has occasional fluttering in the chest, no exertional symptoms. Breathing is good, some deconditioning at baseline. She does no formal activity. She needs refills. She follows with PCP at [facility], we will request labs.   She lives by herself, daughter and husband died 3 and 4 years ago. She was teary-eyed and crying most of the visit. Grief dicussed in detail, encouraged her to seek help. She has a PCP, may need therapist. Has been a part of Grief share. Also discussed other hopeful and positive things in her life.    Past Medical History:  Diagnosis Date   Anxiety    CAD (coronary artery disease)    Mild to moderate proximal LAD disease 2020   Essential hypertension    Hernia    Hypothyroidism    Type 2 diabetes mellitus (HCC)     Past Surgical History:  Procedure Laterality Date   ABDOMINAL HYSTERECTOMY     CERVICAL FUSION     CHOLECYSTECTOMY     COLONOSCOPY     COLONOSCOPY  07/03/2011   Procedure: COLONOSCOPY;  Surgeon: Malissa Hippo, MD;  [facility]: [facility];  Service: Endoscopy;  Laterality: N/A;  1030   ESOPHAGEAL DILATION N/A  04/19/2015   Procedure: ESOPHAGEAL DILATION;  Surgeon: Malissa Hippo, MD;  Location: [facility];  Service: Endoscopy;  Laterality: N/A;   ESOPHAGOGASTRODUODENOSCOPY N/A 04/19/2015   Procedure: ESOPHAGOGASTRODUODENOSCOPY (EGD);  Surgeon: Malissa Hippo, MD;  Location: [facility];  Service: Endoscopy;  Laterality: N/A;  2:05   KNEE SURGERY     Arthroscopic   LEFT HEART CATH AND CORONARY ANGIOGRAPHY N/A 08/25/2018   Procedure: LEFT HEART CATH AND CORONARY ANGIOGRAPHY;  Surgeon: Marykay Lex, MD;  Location: [facility] INVASIVE CV LAB;  Service: Cardiovascular;  Laterality: N/A;   UPPER GASTROINTESTINAL ENDOSCOPY      Current Medications: Current Meds  Medication Sig   acetaminophen (TYLENOL) 650 MG CR tablet Take 1,300 mg by mouth every 8 (eight) hours as needed for pain.   allopurinol (ZYLOPRIM) 300 MG tablet Take 300 mg by mouth daily.    Calcium Carb-Cholecalciferol (CALCIUM-VITAMIN D3) 500-400 MG-UNIT TABS Take 1 tablet by mouth daily.   colchicine 0.6 MG tablet Take 0.6 mg by mouth as needed.   DULoxetine (CYMBALTA) 60 MG capsule Take 60 mg by mouth 2 (two) times daily.    esomeprazole (NEXIUM) 40 MG capsule TAKE ONE CAPSULE BY MOUTH TWICE DAILY   furosemide (LASIX) 40 MG tablet Take 40 mg by mouth daily.    insulin glargine (LANTUS) 100 UNIT/ML injection Inject 32 Units into the skin daily.  levothyroxine (SYNTHROID) 150 MCG tablet Take 150 mcg by mouth daily before breakfast.   losartan (COZAAR) 50 MG tablet Take 50 mg by mouth every morning.   metFORMIN (GLUCOPHAGE) 500 MG tablet Take 1 tablet by mouth 2 (two) times a day.   metoprolol tartrate (LOPRESSOR) 50 MG tablet Take 50 mg by mouth 2 (two) times a day.    pregabalin (LYRICA) 50 MG capsule Take 50 mg by mouth 2 (two) times daily.   Probiotic Product (PROBIOTIC-10 PO) Take 1 tablet by mouth daily.   simvastatin (ZOCOR) 40 MG tablet Take 40 mg by mouth at bedtime.    spironolactone (ALDACTONE) 25 MG tablet Take 25 mg by mouth  daily.   traMADol (ULTRAM) 50 MG tablet Take 50 mg by mouth twice daily, may take a third 50 mg dose mid day as needed for pain   Turmeric 500 MG CAPS Take 500 mg by mouth 2 (two) times daily.   Zinc Sulfate (ZINC 15 PO) Take 15 mg by mouth daily.   [DISCONTINUED] isosorbide mononitrate (IMDUR) 30 MG 24 hr tablet TAKE ONE TABLET BY MOUTH EVERY MORNING   Current Facility-Administered Medications for the 09/16/21 encounter (Office Visit) with Fransico Michael, Voula Waln H, PA-C  Medication   sodium chloride flush (NS) 0.9 % injection 3 mL     Allergies:   Elemental sulfur and Adhesive [tape]   Social History   Socioeconomic History   Marital status: Married    Spouse name: Not on file   Number of children: Not on file   Years of education: Not on file   Highest education level: Not on file  Occupational History   Not on file  Tobacco Use   Smoking status: Former    Packs/day: 0.50    Years: 3.00    Total pack years: 1.50    Types: Cigarettes    Quit date: 06/09/1991    Years since quitting: 30.2   Smokeless tobacco: Never   Tobacco comments:    Patient states that she smoked rare  Vaping Use   Vaping Use: Never used  Substance and Sexual Activity   Alcohol use: No   Drug use: No   Sexual activity: Not on file  Other Topics Concern   Not on file  Social History Narrative   Not on file   Social Determinants of Health   Financial Resource Strain: Not on file  Food Insecurity: Not on file  Transportation Needs: Not on file  Physical Activity: Not on file  Stress: Not on file  Social Connections: Not on file     Family History: The patient's family history includes Arthritis in her mother and sister; COPD in her sister; Diabetes in her sister; Pancreatic cancer in her father.  ROS:   Please see the history of present illness.     All other systems reviewed and are negative.  EKGs/Labs/Other Studies Reviewed:    The following studies were reviewed today:  Echocardiogram  08/19/2018:  1. The left ventricle has normal systolic function with an ejection  fraction of 60-65%. The cavity size was normal. Left ventricular diastolic  Doppler parameters are consistent with impaired relaxation. Indeterminate  filling pressures.   2. The right ventricle has normal systolic function. The cavity was  normal. There is no increase in right ventricular wall thickness.   3. The mitral valve is grossly normal.   4. The tricuspid valve is grossly normal.   5. The aortic valve was not well visualized. Aortic valve regurgitation  is mild by color flow Doppler.   6. The aorta is normal in size and structure.   7. The interatrial septum was not assessed.   8. Technically difficult study due to body habitus.    Cardiac catheterization 08/25/2018: Prox LAD lesion is 40% stenosed. = DFR 0.91 with an FFR 0.85. Mid LAD lesion is 30% stenosed. Dist LAD lesion is 50% stenosed. Otherwise angiographically normal coronary arteries with a left dominant system. Normal LVEDP   SUMMARY Angiographically minimal very proximal LAD lesion that surprisingly was borderline by DFR and moderate stenosis by FFR (0.85 --it is possible that FFR is considered from the distal vessel that this would have been an positive if measured at the apex) --> not physiologically significant by FFR therefore no PCI Otherwise minimal CAD with left dominant system. Normal LVEDP  EKG:  EKG is not ordered today.    Recent Labs: No results found for requested labs within last 365 days.  Recent Lipid Panel No results found for: "CHOL", "TRIG", "HDL", "CHOLHDL", "VLDL", "LDLCALC", "LDLDIRECT"    Physical Exam:    VS:  BP 130/84   Pulse 71   Ht 5\' 7"  (1.702 m)   Wt 195 lb (88.5 kg)   SpO2 97%   BMI 30.54 kg/m     Wt Readings from Last 3 Encounters:  09/16/21 195 lb (88.5 kg)  11/27/20 219 lb 14.4 oz (99.7 kg)  09/14/20 226 lb 9.6 oz (102.8 kg)     GEN:  Well nourished, well developed in no acute  distress HEENT: Normal NECK: No JVD; No carotid bruits LYMPHATICS: No lymphadenopathy CARDIAC: RRR, no murmurs, rubs, gallops RESPIRATORY:  Clear to auscultation without rales, wheezing or rhonchi  ABDOMEN: Soft, non-tender, non-distended MUSCULOSKELETAL:  No edema; No deformity  SKIN: Warm and dry NEUROLOGIC:  Alert and oriented x 3 PSYCHIATRIC:  Normal affect   ASSESSMENT:    1. Coronary artery disease involving native coronary artery of native heart with angina pectoris (HCC)   2. Essential hypertension   3. Hyperlipidemia, mixed    PLAN:    In order of problems listed above:  Nonobstructive CAD Patient denies anginal symptoms, but is not active at baseline. I encouraged activity as tolerated 3-4 times a week. No further ischemic work-up indicated at this time. She did not tolerate ASA due to stomach issues. Continue Imdur, BB and statin therapy. We refill her cardiac medications.   HTN BP is good today. Continue Losartan 50mg  daily, Lopressor 50mg  BID, spironolactone 25mg  daily, Imdur 30mg  daily. We will send in refills as above.   HLD We will request labs. Continue simvastatin.   Disposition: Follow up in 1 year(s) with MD/APP    Signed, Jakyria Bleau 09/16/20, PA-C  09/16/2021 4:04 PM    [facility]

## 2021-10-02 DIAGNOSIS — E039 Hypothyroidism, unspecified: Secondary | ICD-10-CM | POA: Diagnosis not present

## 2021-11-29 ENCOUNTER — Other Ambulatory Visit (INDEPENDENT_AMBULATORY_CARE_PROVIDER_SITE_OTHER): Payer: Self-pay | Admitting: Gastroenterology

## 2021-11-29 NOTE — Telephone Encounter (Signed)
Needs office visit for further refills

## 2021-12-03 ENCOUNTER — Other Ambulatory Visit (INDEPENDENT_AMBULATORY_CARE_PROVIDER_SITE_OTHER): Payer: Self-pay | Admitting: Gastroenterology

## 2022-02-20 DIAGNOSIS — M79676 Pain in unspecified toe(s): Secondary | ICD-10-CM | POA: Diagnosis not present

## 2022-02-20 DIAGNOSIS — E1142 Type 2 diabetes mellitus with diabetic polyneuropathy: Secondary | ICD-10-CM | POA: Diagnosis not present

## 2022-02-20 DIAGNOSIS — B351 Tinea unguium: Secondary | ICD-10-CM | POA: Diagnosis not present

## 2022-02-20 DIAGNOSIS — L84 Corns and callosities: Secondary | ICD-10-CM | POA: Diagnosis not present

## 2022-02-21 ENCOUNTER — Encounter (INDEPENDENT_AMBULATORY_CARE_PROVIDER_SITE_OTHER): Payer: Self-pay | Admitting: *Deleted

## 2022-02-21 DIAGNOSIS — Z7984 Long term (current) use of oral hypoglycemic drugs: Secondary | ICD-10-CM | POA: Diagnosis not present

## 2022-02-21 DIAGNOSIS — Z6829 Body mass index (BMI) 29.0-29.9, adult: Secondary | ICD-10-CM | POA: Diagnosis not present

## 2022-02-21 DIAGNOSIS — F331 Major depressive disorder, recurrent, moderate: Secondary | ICD-10-CM | POA: Diagnosis not present

## 2022-02-21 DIAGNOSIS — Z794 Long term (current) use of insulin: Secondary | ICD-10-CM | POA: Diagnosis not present

## 2022-02-21 DIAGNOSIS — E1140 Type 2 diabetes mellitus with diabetic neuropathy, unspecified: Secondary | ICD-10-CM | POA: Diagnosis not present

## 2022-03-20 DIAGNOSIS — E7849 Other hyperlipidemia: Secondary | ICD-10-CM | POA: Diagnosis not present

## 2022-03-20 DIAGNOSIS — R5383 Other fatigue: Secondary | ICD-10-CM | POA: Diagnosis not present

## 2022-03-20 DIAGNOSIS — M109 Gout, unspecified: Secondary | ICD-10-CM | POA: Diagnosis not present

## 2022-03-20 DIAGNOSIS — I10 Essential (primary) hypertension: Secondary | ICD-10-CM | POA: Diagnosis not present

## 2022-03-20 DIAGNOSIS — E1165 Type 2 diabetes mellitus with hyperglycemia: Secondary | ICD-10-CM | POA: Diagnosis not present

## 2022-03-20 DIAGNOSIS — K219 Gastro-esophageal reflux disease without esophagitis: Secondary | ICD-10-CM | POA: Diagnosis not present

## 2022-03-20 DIAGNOSIS — E039 Hypothyroidism, unspecified: Secondary | ICD-10-CM | POA: Diagnosis not present

## 2022-04-07 DIAGNOSIS — I10 Essential (primary) hypertension: Secondary | ICD-10-CM | POA: Diagnosis not present

## 2022-04-07 DIAGNOSIS — M109 Gout, unspecified: Secondary | ICD-10-CM | POA: Diagnosis not present

## 2022-04-07 DIAGNOSIS — R413 Other amnesia: Secondary | ICD-10-CM | POA: Diagnosis not present

## 2022-04-07 DIAGNOSIS — E7801 Familial hypercholesterolemia: Secondary | ICD-10-CM | POA: Diagnosis not present

## 2022-04-07 DIAGNOSIS — R609 Edema, unspecified: Secondary | ICD-10-CM | POA: Diagnosis not present

## 2022-04-07 DIAGNOSIS — E039 Hypothyroidism, unspecified: Secondary | ICD-10-CM | POA: Diagnosis not present

## 2022-04-07 DIAGNOSIS — Z1331 Encounter for screening for depression: Secondary | ICD-10-CM | POA: Diagnosis not present

## 2022-04-07 DIAGNOSIS — E1165 Type 2 diabetes mellitus with hyperglycemia: Secondary | ICD-10-CM | POA: Diagnosis not present

## 2022-04-07 DIAGNOSIS — Z1389 Encounter for screening for other disorder: Secondary | ICD-10-CM | POA: Diagnosis not present

## 2022-04-07 DIAGNOSIS — K219 Gastro-esophageal reflux disease without esophagitis: Secondary | ICD-10-CM | POA: Diagnosis not present

## 2022-04-07 DIAGNOSIS — R0789 Other chest pain: Secondary | ICD-10-CM | POA: Diagnosis not present

## 2022-04-07 DIAGNOSIS — M25511 Pain in right shoulder: Secondary | ICD-10-CM | POA: Diagnosis not present

## 2022-04-11 DIAGNOSIS — M25511 Pain in right shoulder: Secondary | ICD-10-CM | POA: Diagnosis not present

## 2022-04-21 DIAGNOSIS — M19011 Primary osteoarthritis, right shoulder: Secondary | ICD-10-CM | POA: Diagnosis not present

## 2022-04-21 DIAGNOSIS — M25511 Pain in right shoulder: Secondary | ICD-10-CM | POA: Diagnosis not present

## 2022-04-21 DIAGNOSIS — M75121 Complete rotator cuff tear or rupture of right shoulder, not specified as traumatic: Secondary | ICD-10-CM | POA: Diagnosis not present

## 2022-04-21 DIAGNOSIS — M67813 Other specified disorders of tendon, right shoulder: Secondary | ICD-10-CM | POA: Diagnosis not present

## 2022-04-21 DIAGNOSIS — M6289 Other specified disorders of muscle: Secondary | ICD-10-CM | POA: Diagnosis not present

## 2022-04-22 DIAGNOSIS — M25511 Pain in right shoulder: Secondary | ICD-10-CM | POA: Diagnosis not present

## 2022-04-28 DIAGNOSIS — M25511 Pain in right shoulder: Secondary | ICD-10-CM | POA: Diagnosis not present

## 2022-05-05 ENCOUNTER — Telehealth: Payer: Self-pay

## 2022-05-05 ENCOUNTER — Telehealth: Payer: Self-pay | Admitting: *Deleted

## 2022-05-05 NOTE — Telephone Encounter (Signed)
   Pre-operative Risk Assessment    Patient Name: Melanie Mathews  DOB: 01/31/1945 MRN: [id number]     Request for Surgical Clearance    Procedure:  Right reverse total shoulder replacement   Date of Surgery:  Clearance TBD                                 Surgeon:  Dr. Teryl Lucy Surgeon's Group or Practice Name:  [facility]  Phone number:  [phone] Fax number:  [phone]   Type of Clearance Requested:   - Medical    Type of Anesthesia:  General    Additional requests/questions:    SignedVernard Gambles   05/05/2022, 11:11 AM

## 2022-05-05 NOTE — Telephone Encounter (Signed)
   Name: Melanie Mathews  DOB: Oct 19, 1945  MRN: [id number]  Primary Cardiologist: Nona Dell, MD  Chart reviewed as part of pre-operative protocol coverage. Because of Melanie Mathews's past medical history and time since last visit, she will require a follow-up telephone visit in order to better assess preoperative cardiovascular risk.  Pre-op covering staff: - Please schedule appointment and call patient to inform them. If patient already had an upcoming appointment within acceptable timeframe, please add "pre-op clearance" to the appointment notes so provider is aware. - Please contact requesting surgeon's office via preferred method (i.e, phone, fax) to inform them of need for appointment prior to surgery.  No medications indicated as needing held.  Sharlene Dory, PA-C  05/05/2022, 1:00 PM

## 2022-05-05 NOTE — Telephone Encounter (Signed)
Pt has been scheduled for a tele visit 05/07/22.  Consent on file / medication reconciled.    Patient Consent for Virtual Visit        Melanie Mathews has provided verbal consent on 05/05/2022 for a virtual visit (video or telephone).   CONSENT FOR VIRTUAL VISIT FOR:  Melanie Mathews  By participating in this virtual visit I agree to the following:  I hereby voluntarily request, consent and authorize [facility] and its employed or contracted physicians, physician assistants, nurse practitioners or other licensed health care professionals (the Practitioner), to provide me with telemedicine health care services (the "Services") as deemed necessary by the treating Practitioner. I acknowledge and consent to receive the Services by the Practitioner via telemedicine. I understand that the telemedicine visit will involve communicating with the Practitioner through live audiovisual communication technology and the disclosure of certain medical information by electronic transmission. I acknowledge that I have been given the opportunity to request an in-person assessment or other available alternative prior to the telemedicine visit and am voluntarily participating in the telemedicine visit.  I understand that I have the right to withhold or withdraw my consent to the use of telemedicine in the course of my care at any time, without affecting my right to future care or treatment, and that the Practitioner or I may terminate the telemedicine visit at any time. I understand that I have the right to inspect all information obtained and/or recorded in the course of the telemedicine visit and may receive copies of available information for a reasonable fee.  I understand that some of the potential risks of receiving the Services via telemedicine include:  Delay or interruption in medical evaluation due to technological equipment failure or disruption; Information transmitted may not be sufficient (e.g.  poor resolution of images) to allow for appropriate medical decision making by the Practitioner; and/or  In rare instances, security protocols could fail, causing a breach of personal health information.  Furthermore, I acknowledge that it is my responsibility to provide information about my medical history, conditions and care that is complete and accurate to the best of my ability. I acknowledge that Practitioner's advice, recommendations, and/or decision may be based on factors not within their control, such as incomplete or inaccurate data provided by me or distortions of diagnostic images or specimens that may result from electronic transmissions. I understand that the practice of medicine is not an exact science and that Practitioner makes no warranties or guarantees regarding treatment outcomes. I acknowledge that a copy of this consent can be made available to me via my patient portal [facility] MyChart), or I can request a printed copy by calling the office of [facility].    I understand that my insurance will be billed for this visit.   I have read or had this consent read to me. I understand the contents of this consent, which adequately explains the benefits and risks of the Services being provided via telemedicine.  I have been provided ample opportunity to ask questions regarding this consent and the Services and have had my questions answered to my satisfaction. I give my informed consent for the services to be provided through the use of telemedicine in my medical care

## 2022-05-05 NOTE — Telephone Encounter (Signed)
Pt has been scheduled for a tele visit 05/07/22.  Consent on file / medications reconciled.

## 2022-05-07 ENCOUNTER — Ambulatory Visit: Payer: HMO | Attending: Internal Medicine

## 2022-05-07 DIAGNOSIS — Z01810 Encounter for preprocedural cardiovascular examination: Secondary | ICD-10-CM

## 2022-05-07 NOTE — Progress Notes (Signed)
Virtual Visit via Telephone Note   Because of Melanie Mathews's co-morbid illnesses, she is at least at moderate risk for complications without adequate follow up.  This format is felt to be most appropriate for this patient at this time.  The patient did not have access to video technology/had technical difficulties with video requiring transitioning to audio format only (telephone).  All issues noted in this document were discussed and addressed.  No physical exam could be performed with this format.  Please refer to the patient's chart for her consent to telehealth for [facility].  Evaluation Performed:  Preoperative cardiovascular risk assessment _____________   Date:  05/07/2022   Patient ID:  Melanie Mathews, DOB 08/13/1945, MRN [id number] Patient Location:  Home Provider location:   Office  Primary Care Provider:  Sheela Stack Primary Cardiologist:  Nona Dell, MD  Chief Complaint / Patient Profile   77 y.o. y/o female with a h/o nonobstructive CAD in LAD on cath 2020, hypertension, hyperlipidemia, DM 2 who is pending right shoulder replacement and presents today for telephonic preoperative cardiovascular risk assessment.  History of Present Illness    Melanie Mathews is a 77 y.o. female who presents via audio/video conferencing for a telehealth visit today.  Pt was last seen in cardiology clinic on 09/16/2021 by Cadence Fransico Michael, PA.  At that time Melanie Mathews was doing well other than some occasional fluttering in her chest.  The patient is now pending procedure as outlined above. Since her last visit, she tells me that she takes medications for palpitations and they have been well-controlled.  She does have some shortness of breath with walking uphill but this is nothing new for her.  She is able to do her own cooking.  She has had no chest pains but does suffer from coronary spasms from time to time.  She really wants to go to the beach before her  surgery.  She meets 4 METS on the DASI   No medications need to be held.     Past Medical History    Past Medical History:  Diagnosis Date   Anxiety    CAD (coronary artery disease)    Mild to moderate proximal LAD disease 2020   Essential hypertension    Hernia    Hypothyroidism    Type 2 diabetes mellitus (HCC)    Past Surgical History:  Procedure Laterality Date   ABDOMINAL HYSTERECTOMY     CERVICAL FUSION     CHOLECYSTECTOMY     COLONOSCOPY     COLONOSCOPY  07/03/2011   Procedure: COLONOSCOPY;  Surgeon: Malissa Hippo, MD;  [facility];  Service: Endoscopy;  Laterality: N/A;  1030   ESOPHAGEAL DILATION N/A 04/19/2015   Procedure: ESOPHAGEAL DILATION;  Surgeon: Malissa Hippo, MD;  [facility];  Service: Endoscopy;  Laterality: N/A;   ESOPHAGOGASTRODUODENOSCOPY N/A 04/19/2015   Procedure: ESOPHAGOGASTRODUODENOSCOPY (EGD);  Surgeon: Malissa Hippo, MD;  [facility];  Service: Endoscopy;  Laterality: N/A;  2:05   KNEE SURGERY     Arthroscopic   LEFT HEART CATH AND CORONARY ANGIOGRAPHY N/A 08/25/2018   Procedure: LEFT HEART CATH AND CORONARY ANGIOGRAPHY;  Surgeon: Marykay Lex, MD;  Location: [facility] INVASIVE CV LAB;  Service: Cardiovascular;  Laterality: N/A;   UPPER GASTROINTESTINAL ENDOSCOPY      Allergies  Allergies  Allergen Reactions   Elemental Sulfur Itching and Swelling   Adhesive [Tape] Rash  Home Medications    Prior to Admission medications   Medication Sig Start Date End Date Taking? Authorizing Provider  acetaminophen (TYLENOL) 650 MG CR tablet Take 1,300 mg by mouth every 8 (eight) hours as needed for pain.    [provider]  allopurinol (ZYLOPRIM) 300 MG tablet Take 300 mg by mouth daily.  04/23/18   [provider]  Calcium Carb-Cholecalciferol (CALCIUM-VITAMIN D3) 500-400 MG-UNIT TABS Take 1 tablet by mouth daily. 04/29/18   [provider]  colchicine 0.6 MG tablet Take 0.6 mg by mouth as  needed.    [provider]  DULoxetine (CYMBALTA) 60 MG capsule Take 60 mg by mouth 2 (two) times daily.  04/23/18   [provider]  esomeprazole (NEXIUM) 40 MG capsule TAKE ONE CAPSULE BY MOUTH TWICE DAILY 09/10/21   Carlan, Chelsea L, NP  furosemide (LASIX) 40 MG tablet Take 40 mg by mouth daily.     [provider]  insulin glargine (LANTUS) 100 UNIT/ML injection Inject 22 Units into the skin daily.    [provider]  isosorbide mononitrate (IMDUR) 30 MG 24 hr tablet Take 1 tablet (30 mg total) by mouth every morning. 09/16/21   Furth, Cadence H, PA-C  levothyroxine (SYNTHROID) 125 MCG tablet Take 125 mcg by mouth daily before breakfast.    [provider]  losartan (COZAAR) 50 MG tablet Take 50 mg by mouth every morning. 07/24/20   [provider]  metoprolol tartrate (LOPRESSOR) 50 MG tablet Take 50 mg by mouth 2 (two) times a day.  04/23/18   [provider]  pregabalin (LYRICA) 50 MG capsule Take 50 mg by mouth 2 (two) times daily. 09/07/20   [provider]  Probiotic Product (PROBIOTIC-10 PO) Take 1 tablet by mouth daily.    [provider]  simvastatin (ZOCOR) 40 MG tablet Take 40 mg by mouth at bedtime.  04/23/18   [provider]  spironolactone (ALDACTONE) 25 MG tablet Take 25 mg by mouth daily. 09/10/21   [provider]  traMADol (ULTRAM) 50 MG tablet Take 50 mg by mouth twice daily, may take a third 50 mg dose mid day as needed for pain 04/28/18   [provider]  Turmeric 500 MG CAPS Take 500 mg by mouth 2 (two) times daily.    [provider]  Zinc Sulfate (ZINC 15 PO) Take 15 mg by mouth daily.    [provider]    Physical Exam    Vital Signs:  Lyric Rossano Amundson does not have vital signs available for review today.  Given telephonic nature of communication, physical exam is limited. AAOx3. NAD. Normal affect.  Speech and respirations are unlabored.  Accessory  Clinical Findings    None  Assessment & Plan    1.  Preoperative Cardiovascular Risk Assessment:  Ms. Broxton's perioperative risk of a major cardiac event is 0.9% according to the Revised Cardiac Risk Index (RCRI).  Therefore, she is at low risk for perioperative complications.   Her functional capacity is fair at 4.64 METs according to the [place] Activity Status Index (DASI). Recommendations: According to ACC/AHA guidelines, no further cardiovascular testing needed.  The patient may proceed to surgery at acceptable risk.     A copy of this note will be routed to requesting surgeon.  Time:   Today, I have spent 18 minutes with the patient with telehealth technology discussing medical history, symptoms, and management plan.     Sharlene Dory, PA-C  05/07/2022, 10:05 AM

## 2022-05-20 DIAGNOSIS — R030 Elevated blood-pressure reading, without diagnosis of hypertension: Secondary | ICD-10-CM | POA: Diagnosis not present

## 2022-05-20 DIAGNOSIS — Z6831 Body mass index (BMI) 31.0-31.9, adult: Secondary | ICD-10-CM | POA: Diagnosis not present

## 2022-05-20 DIAGNOSIS — M25511 Pain in right shoulder: Secondary | ICD-10-CM | POA: Diagnosis not present

## 2022-05-20 DIAGNOSIS — M75101 Unspecified rotator cuff tear or rupture of right shoulder, not specified as traumatic: Secondary | ICD-10-CM | POA: Diagnosis not present

## 2022-05-22 DIAGNOSIS — M79676 Pain in unspecified toe(s): Secondary | ICD-10-CM | POA: Diagnosis not present

## 2022-05-22 DIAGNOSIS — B351 Tinea unguium: Secondary | ICD-10-CM | POA: Diagnosis not present

## 2022-05-22 DIAGNOSIS — E1142 Type 2 diabetes mellitus with diabetic polyneuropathy: Secondary | ICD-10-CM | POA: Diagnosis not present

## 2022-05-22 DIAGNOSIS — L84 Corns and callosities: Secondary | ICD-10-CM | POA: Diagnosis not present

## 2022-07-21 DIAGNOSIS — J020 Streptococcal pharyngitis: Secondary | ICD-10-CM | POA: Diagnosis not present

## 2022-07-21 DIAGNOSIS — Z6832 Body mass index (BMI) 32.0-32.9, adult: Secondary | ICD-10-CM | POA: Diagnosis not present

## 2022-07-21 DIAGNOSIS — R030 Elevated blood-pressure reading, without diagnosis of hypertension: Secondary | ICD-10-CM | POA: Diagnosis not present

## 2022-08-04 DIAGNOSIS — Z6832 Body mass index (BMI) 32.0-32.9, adult: Secondary | ICD-10-CM | POA: Diagnosis not present

## 2022-08-04 DIAGNOSIS — R413 Other amnesia: Secondary | ICD-10-CM | POA: Diagnosis not present

## 2022-08-04 DIAGNOSIS — E1165 Type 2 diabetes mellitus with hyperglycemia: Secondary | ICD-10-CM | POA: Diagnosis not present

## 2022-08-04 DIAGNOSIS — G629 Polyneuropathy, unspecified: Secondary | ICD-10-CM | POA: Diagnosis not present

## 2022-08-04 DIAGNOSIS — R609 Edema, unspecified: Secondary | ICD-10-CM | POA: Diagnosis not present

## 2022-08-04 DIAGNOSIS — K219 Gastro-esophageal reflux disease without esophagitis: Secondary | ICD-10-CM | POA: Diagnosis not present

## 2022-08-04 DIAGNOSIS — F411 Generalized anxiety disorder: Secondary | ICD-10-CM | POA: Diagnosis not present

## 2022-08-04 DIAGNOSIS — I10 Essential (primary) hypertension: Secondary | ICD-10-CM | POA: Diagnosis not present

## 2022-08-04 DIAGNOSIS — E039 Hypothyroidism, unspecified: Secondary | ICD-10-CM | POA: Diagnosis not present

## 2022-08-04 DIAGNOSIS — M109 Gout, unspecified: Secondary | ICD-10-CM | POA: Diagnosis not present

## 2022-08-04 DIAGNOSIS — E785 Hyperlipidemia, unspecified: Secondary | ICD-10-CM | POA: Diagnosis not present

## 2022-08-04 DIAGNOSIS — M545 Low back pain, unspecified: Secondary | ICD-10-CM | POA: Diagnosis not present

## 2022-08-21 DIAGNOSIS — M79676 Pain in unspecified toe(s): Secondary | ICD-10-CM | POA: Diagnosis not present

## 2022-08-21 DIAGNOSIS — E1142 Type 2 diabetes mellitus with diabetic polyneuropathy: Secondary | ICD-10-CM | POA: Diagnosis not present

## 2022-08-21 DIAGNOSIS — L84 Corns and callosities: Secondary | ICD-10-CM | POA: Diagnosis not present

## 2022-08-21 DIAGNOSIS — L603 Nail dystrophy: Secondary | ICD-10-CM | POA: Diagnosis not present

## 2022-09-11 DIAGNOSIS — E039 Hypothyroidism, unspecified: Secondary | ICD-10-CM | POA: Diagnosis not present

## 2022-09-16 ENCOUNTER — Other Ambulatory Visit: Payer: Self-pay | Admitting: Medical

## 2022-09-18 ENCOUNTER — Ambulatory Visit: Payer: PPO | Attending: Cardiology | Admitting: Cardiology

## 2022-09-18 NOTE — Progress Notes (Deleted)
Cardiology Office Note  Date: 09/18/2022   ID: Robine, Gaskamp 08-Mar-1945, MRN [id number]  History of Present Illness: Melanie Mathews is a 77 y.o. female last seen in August 2023 by Ms. Lorna Few, I reviewed the note.  Physical Exam: VS:  There were no vitals taken for this visit., BMI There is no height or weight on file to calculate BMI.  Wt Readings from Last 3 Encounters:  09/16/21 195 lb (88.5 kg)  11/27/20 219 lb 14.4 oz (99.7 kg)  09/14/20 226 lb 9.6 oz (102.8 kg)    General: Patient appears comfortable at rest. HEENT: Conjunctiva and lids normal, oropharynx clear with moist mucosa. Neck: Supple, no elevated JVP or carotid bruits, no thyromegaly. Lungs: Clear to auscultation, nonlabored breathing at rest. Cardiac: Regular rate and rhythm, no S3 or significant systolic murmur, no pericardial rub. Abdomen: Soft, nontender, no hepatomegaly, bowel sounds present, no guarding or rebound. Extremities: No pitting edema, distal pulses 2+. Skin: Warm and dry. Musculoskeletal: No kyphosis. Neuropsychiatric: Alert and oriented x3, affect grossly appropriate.  ECG:  An ECG dated 09/16/2021 was personally reviewed today and demonstrated:  Sinus rhythm with low voltage.  Labwork:  July 2024: Hemoglobin 12.5, platelets 193, BUN 23, creatinine 1, potassium 5, AST 16, ALT 16, cholesterol 147, triglycerides 87, HDL 56, LDL 75, hemoglobin A1c 8.9%, TSH 0.449  Other Studies Reviewed Today:  No interval cardiac testing for review today.  Assessment and Plan:  1.  CAD, mild to moderate proximal LAD disease as of 2020.  LVEF 60 to 65%.  2.  Essential hypertension.  3.  Mixed hyperlipidemia.  LDL 75 in July.  Disposition:  Follow up {follow up:15908}  Signed, Jonelle Sidle, M.D., F.A.C.C. [facility] at [facility]

## 2022-09-19 ENCOUNTER — Other Ambulatory Visit: Payer: Self-pay | Admitting: Medical

## 2022-10-23 DIAGNOSIS — E1140 Type 2 diabetes mellitus with diabetic neuropathy, unspecified: Secondary | ICD-10-CM | POA: Diagnosis not present

## 2022-10-23 DIAGNOSIS — E039 Hypothyroidism, unspecified: Secondary | ICD-10-CM | POA: Diagnosis not present

## 2022-10-23 DIAGNOSIS — M109 Gout, unspecified: Secondary | ICD-10-CM | POA: Diagnosis not present

## 2022-10-23 DIAGNOSIS — R5383 Other fatigue: Secondary | ICD-10-CM | POA: Diagnosis not present

## 2022-10-23 DIAGNOSIS — E782 Mixed hyperlipidemia: Secondary | ICD-10-CM | POA: Diagnosis not present

## 2022-10-23 DIAGNOSIS — E7849 Other hyperlipidemia: Secondary | ICD-10-CM | POA: Diagnosis not present

## 2022-10-29 DIAGNOSIS — R2689 Other abnormalities of gait and mobility: Secondary | ICD-10-CM | POA: Diagnosis not present

## 2022-10-29 DIAGNOSIS — G629 Polyneuropathy, unspecified: Secondary | ICD-10-CM | POA: Diagnosis not present

## 2022-10-29 DIAGNOSIS — Z23 Encounter for immunization: Secondary | ICD-10-CM | POA: Diagnosis not present

## 2022-10-29 DIAGNOSIS — E1165 Type 2 diabetes mellitus with hyperglycemia: Secondary | ICD-10-CM | POA: Diagnosis not present

## 2022-10-29 DIAGNOSIS — E785 Hyperlipidemia, unspecified: Secondary | ICD-10-CM | POA: Diagnosis not present

## 2022-10-29 DIAGNOSIS — N182 Chronic kidney disease, stage 2 (mild): Secondary | ICD-10-CM | POA: Diagnosis not present

## 2022-10-29 DIAGNOSIS — Z1331 Encounter for screening for depression: Secondary | ICD-10-CM | POA: Diagnosis not present

## 2022-10-29 DIAGNOSIS — R490 Dysphonia: Secondary | ICD-10-CM | POA: Diagnosis not present

## 2022-10-29 DIAGNOSIS — I10 Essential (primary) hypertension: Secondary | ICD-10-CM | POA: Diagnosis not present

## 2022-10-29 DIAGNOSIS — M109 Gout, unspecified: Secondary | ICD-10-CM | POA: Diagnosis not present

## 2022-10-29 DIAGNOSIS — R609 Edema, unspecified: Secondary | ICD-10-CM | POA: Diagnosis not present

## 2022-10-29 DIAGNOSIS — Z1389 Encounter for screening for other disorder: Secondary | ICD-10-CM | POA: Diagnosis not present

## 2022-11-11 DIAGNOSIS — R030 Elevated blood-pressure reading, without diagnosis of hypertension: Secondary | ICD-10-CM | POA: Diagnosis not present

## 2022-11-11 DIAGNOSIS — M25562 Pain in left knee: Secondary | ICD-10-CM | POA: Diagnosis not present

## 2022-11-11 DIAGNOSIS — R490 Dysphonia: Secondary | ICD-10-CM | POA: Diagnosis not present

## 2022-11-17 DIAGNOSIS — R269 Unspecified abnormalities of gait and mobility: Secondary | ICD-10-CM | POA: Diagnosis not present

## 2022-11-17 DIAGNOSIS — M6281 Muscle weakness (generalized): Secondary | ICD-10-CM | POA: Diagnosis not present

## 2022-11-17 DIAGNOSIS — M25562 Pain in left knee: Secondary | ICD-10-CM | POA: Diagnosis not present

## 2022-11-19 DIAGNOSIS — F419 Anxiety disorder, unspecified: Secondary | ICD-10-CM | POA: Diagnosis not present

## 2022-11-19 DIAGNOSIS — F331 Major depressive disorder, recurrent, moderate: Secondary | ICD-10-CM | POA: Diagnosis not present

## 2022-11-19 DIAGNOSIS — F4321 Adjustment disorder with depressed mood: Secondary | ICD-10-CM | POA: Diagnosis not present

## 2022-11-21 DIAGNOSIS — M25562 Pain in left knee: Secondary | ICD-10-CM | POA: Diagnosis not present

## 2022-11-21 DIAGNOSIS — R269 Unspecified abnormalities of gait and mobility: Secondary | ICD-10-CM | POA: Diagnosis not present

## 2022-11-21 DIAGNOSIS — M6281 Muscle weakness (generalized): Secondary | ICD-10-CM | POA: Diagnosis not present

## 2022-11-25 DIAGNOSIS — R269 Unspecified abnormalities of gait and mobility: Secondary | ICD-10-CM | POA: Diagnosis not present

## 2022-11-25 DIAGNOSIS — M25562 Pain in left knee: Secondary | ICD-10-CM | POA: Diagnosis not present

## 2022-11-25 DIAGNOSIS — M6281 Muscle weakness (generalized): Secondary | ICD-10-CM | POA: Diagnosis not present

## 2022-11-27 DIAGNOSIS — B351 Tinea unguium: Secondary | ICD-10-CM | POA: Diagnosis not present

## 2022-11-27 DIAGNOSIS — E1142 Type 2 diabetes mellitus with diabetic polyneuropathy: Secondary | ICD-10-CM | POA: Diagnosis not present

## 2022-11-27 DIAGNOSIS — M79676 Pain in unspecified toe(s): Secondary | ICD-10-CM | POA: Diagnosis not present

## 2022-11-27 DIAGNOSIS — L84 Corns and callosities: Secondary | ICD-10-CM | POA: Diagnosis not present

## 2022-11-28 DIAGNOSIS — M25562 Pain in left knee: Secondary | ICD-10-CM | POA: Diagnosis not present

## 2022-11-28 DIAGNOSIS — R269 Unspecified abnormalities of gait and mobility: Secondary | ICD-10-CM | POA: Diagnosis not present

## 2022-11-28 DIAGNOSIS — M6281 Muscle weakness (generalized): Secondary | ICD-10-CM | POA: Diagnosis not present

## 2022-12-02 DIAGNOSIS — R269 Unspecified abnormalities of gait and mobility: Secondary | ICD-10-CM | POA: Diagnosis not present

## 2022-12-02 DIAGNOSIS — M25562 Pain in left knee: Secondary | ICD-10-CM | POA: Diagnosis not present

## 2022-12-02 DIAGNOSIS — M6281 Muscle weakness (generalized): Secondary | ICD-10-CM | POA: Diagnosis not present

## 2022-12-04 DIAGNOSIS — M25562 Pain in left knee: Secondary | ICD-10-CM | POA: Diagnosis not present

## 2022-12-04 DIAGNOSIS — M6281 Muscle weakness (generalized): Secondary | ICD-10-CM | POA: Diagnosis not present

## 2022-12-04 DIAGNOSIS — R269 Unspecified abnormalities of gait and mobility: Secondary | ICD-10-CM | POA: Diagnosis not present

## 2022-12-09 DIAGNOSIS — M6281 Muscle weakness (generalized): Secondary | ICD-10-CM | POA: Diagnosis not present

## 2022-12-09 DIAGNOSIS — M25562 Pain in left knee: Secondary | ICD-10-CM | POA: Diagnosis not present

## 2022-12-09 DIAGNOSIS — R269 Unspecified abnormalities of gait and mobility: Secondary | ICD-10-CM | POA: Diagnosis not present

## 2022-12-11 DIAGNOSIS — M25562 Pain in left knee: Secondary | ICD-10-CM | POA: Diagnosis not present

## 2022-12-11 DIAGNOSIS — R269 Unspecified abnormalities of gait and mobility: Secondary | ICD-10-CM | POA: Diagnosis not present

## 2022-12-11 DIAGNOSIS — M6281 Muscle weakness (generalized): Secondary | ICD-10-CM | POA: Diagnosis not present

## 2022-12-15 DIAGNOSIS — M25562 Pain in left knee: Secondary | ICD-10-CM | POA: Diagnosis not present

## 2022-12-15 DIAGNOSIS — R269 Unspecified abnormalities of gait and mobility: Secondary | ICD-10-CM | POA: Diagnosis not present

## 2022-12-15 DIAGNOSIS — M6281 Muscle weakness (generalized): Secondary | ICD-10-CM | POA: Diagnosis not present

## 2022-12-16 DIAGNOSIS — F419 Anxiety disorder, unspecified: Secondary | ICD-10-CM | POA: Diagnosis not present

## 2022-12-16 DIAGNOSIS — F339 Major depressive disorder, recurrent, unspecified: Secondary | ICD-10-CM | POA: Diagnosis not present

## 2022-12-16 DIAGNOSIS — F4321 Adjustment disorder with depressed mood: Secondary | ICD-10-CM | POA: Diagnosis not present

## 2022-12-23 DIAGNOSIS — M25562 Pain in left knee: Secondary | ICD-10-CM | POA: Diagnosis not present

## 2022-12-23 DIAGNOSIS — M6281 Muscle weakness (generalized): Secondary | ICD-10-CM | POA: Diagnosis not present

## 2022-12-23 DIAGNOSIS — R269 Unspecified abnormalities of gait and mobility: Secondary | ICD-10-CM | POA: Diagnosis not present

## 2022-12-30 DIAGNOSIS — R269 Unspecified abnormalities of gait and mobility: Secondary | ICD-10-CM | POA: Diagnosis not present

## 2022-12-30 DIAGNOSIS — M6281 Muscle weakness (generalized): Secondary | ICD-10-CM | POA: Diagnosis not present

## 2022-12-30 DIAGNOSIS — M25562 Pain in left knee: Secondary | ICD-10-CM | POA: Diagnosis not present

## 2023-01-01 DIAGNOSIS — R269 Unspecified abnormalities of gait and mobility: Secondary | ICD-10-CM | POA: Diagnosis not present

## 2023-01-01 DIAGNOSIS — M6281 Muscle weakness (generalized): Secondary | ICD-10-CM | POA: Diagnosis not present

## 2023-01-01 DIAGNOSIS — M25562 Pain in left knee: Secondary | ICD-10-CM | POA: Diagnosis not present

## 2023-01-05 DIAGNOSIS — M6281 Muscle weakness (generalized): Secondary | ICD-10-CM | POA: Diagnosis not present

## 2023-01-05 DIAGNOSIS — R269 Unspecified abnormalities of gait and mobility: Secondary | ICD-10-CM | POA: Diagnosis not present

## 2023-01-05 DIAGNOSIS — M25562 Pain in left knee: Secondary | ICD-10-CM | POA: Diagnosis not present

## 2023-01-06 DIAGNOSIS — K219 Gastro-esophageal reflux disease without esophagitis: Secondary | ICD-10-CM | POA: Diagnosis not present

## 2023-01-06 DIAGNOSIS — R1314 Dysphagia, pharyngoesophageal phase: Secondary | ICD-10-CM | POA: Diagnosis not present

## 2023-01-08 DIAGNOSIS — R269 Unspecified abnormalities of gait and mobility: Secondary | ICD-10-CM | POA: Diagnosis not present

## 2023-01-08 DIAGNOSIS — M25562 Pain in left knee: Secondary | ICD-10-CM | POA: Diagnosis not present

## 2023-01-08 DIAGNOSIS — M6281 Muscle weakness (generalized): Secondary | ICD-10-CM | POA: Diagnosis not present

## 2023-01-12 DIAGNOSIS — R269 Unspecified abnormalities of gait and mobility: Secondary | ICD-10-CM | POA: Diagnosis not present

## 2023-01-12 DIAGNOSIS — M6281 Muscle weakness (generalized): Secondary | ICD-10-CM | POA: Diagnosis not present

## 2023-01-12 DIAGNOSIS — M25562 Pain in left knee: Secondary | ICD-10-CM | POA: Diagnosis not present

## 2023-01-20 DIAGNOSIS — F419 Anxiety disorder, unspecified: Secondary | ICD-10-CM | POA: Diagnosis not present

## 2023-01-20 DIAGNOSIS — F4321 Adjustment disorder with depressed mood: Secondary | ICD-10-CM | POA: Diagnosis not present

## 2023-01-20 DIAGNOSIS — R269 Unspecified abnormalities of gait and mobility: Secondary | ICD-10-CM | POA: Diagnosis not present

## 2023-01-20 DIAGNOSIS — M25562 Pain in left knee: Secondary | ICD-10-CM | POA: Diagnosis not present

## 2023-01-20 DIAGNOSIS — F339 Major depressive disorder, recurrent, unspecified: Secondary | ICD-10-CM | POA: Diagnosis not present

## 2023-01-20 DIAGNOSIS — M6281 Muscle weakness (generalized): Secondary | ICD-10-CM | POA: Diagnosis not present

## 2023-01-22 DIAGNOSIS — M25562 Pain in left knee: Secondary | ICD-10-CM | POA: Diagnosis not present

## 2023-01-22 DIAGNOSIS — R269 Unspecified abnormalities of gait and mobility: Secondary | ICD-10-CM | POA: Diagnosis not present

## 2023-01-22 DIAGNOSIS — M6281 Muscle weakness (generalized): Secondary | ICD-10-CM | POA: Diagnosis not present

## 2023-01-29 DIAGNOSIS — M6281 Muscle weakness (generalized): Secondary | ICD-10-CM | POA: Diagnosis not present

## 2023-01-29 DIAGNOSIS — M25562 Pain in left knee: Secondary | ICD-10-CM | POA: Diagnosis not present

## 2023-01-29 DIAGNOSIS — R269 Unspecified abnormalities of gait and mobility: Secondary | ICD-10-CM | POA: Diagnosis not present

## 2023-02-03 DIAGNOSIS — M25562 Pain in left knee: Secondary | ICD-10-CM | POA: Diagnosis not present

## 2023-02-03 DIAGNOSIS — M6281 Muscle weakness (generalized): Secondary | ICD-10-CM | POA: Diagnosis not present

## 2023-02-03 DIAGNOSIS — R269 Unspecified abnormalities of gait and mobility: Secondary | ICD-10-CM | POA: Diagnosis not present

## 2023-02-05 DIAGNOSIS — E162 Hypoglycemia, unspecified: Secondary | ICD-10-CM | POA: Diagnosis not present

## 2023-02-05 DIAGNOSIS — E1340 Other specified diabetes mellitus with diabetic neuropathy, unspecified: Secondary | ICD-10-CM | POA: Diagnosis not present

## 2023-02-05 DIAGNOSIS — M6281 Muscle weakness (generalized): Secondary | ICD-10-CM | POA: Diagnosis not present

## 2023-02-05 DIAGNOSIS — E039 Hypothyroidism, unspecified: Secondary | ICD-10-CM | POA: Diagnosis not present

## 2023-02-05 DIAGNOSIS — D649 Anemia, unspecified: Secondary | ICD-10-CM | POA: Diagnosis not present

## 2023-02-05 DIAGNOSIS — N182 Chronic kidney disease, stage 2 (mild): Secondary | ICD-10-CM | POA: Diagnosis not present

## 2023-02-05 DIAGNOSIS — M25562 Pain in left knee: Secondary | ICD-10-CM | POA: Diagnosis not present

## 2023-02-05 DIAGNOSIS — R269 Unspecified abnormalities of gait and mobility: Secondary | ICD-10-CM | POA: Diagnosis not present

## 2023-02-05 DIAGNOSIS — E785 Hyperlipidemia, unspecified: Secondary | ICD-10-CM | POA: Diagnosis not present

## 2023-02-10 DIAGNOSIS — R269 Unspecified abnormalities of gait and mobility: Secondary | ICD-10-CM | POA: Diagnosis not present

## 2023-02-10 DIAGNOSIS — M25562 Pain in left knee: Secondary | ICD-10-CM | POA: Diagnosis not present

## 2023-02-10 DIAGNOSIS — M6281 Muscle weakness (generalized): Secondary | ICD-10-CM | POA: Diagnosis not present

## 2023-02-12 DIAGNOSIS — E1142 Type 2 diabetes mellitus with diabetic polyneuropathy: Secondary | ICD-10-CM | POA: Diagnosis not present

## 2023-02-12 DIAGNOSIS — E1165 Type 2 diabetes mellitus with hyperglycemia: Secondary | ICD-10-CM | POA: Diagnosis not present

## 2023-02-12 DIAGNOSIS — M79676 Pain in unspecified toe(s): Secondary | ICD-10-CM | POA: Diagnosis not present

## 2023-02-12 DIAGNOSIS — R300 Dysuria: Secondary | ICD-10-CM | POA: Diagnosis not present

## 2023-02-12 DIAGNOSIS — R609 Edema, unspecified: Secondary | ICD-10-CM | POA: Diagnosis not present

## 2023-02-12 DIAGNOSIS — R1084 Generalized abdominal pain: Secondary | ICD-10-CM | POA: Diagnosis not present

## 2023-02-12 DIAGNOSIS — L84 Corns and callosities: Secondary | ICD-10-CM | POA: Diagnosis not present

## 2023-02-12 DIAGNOSIS — I10 Essential (primary) hypertension: Secondary | ICD-10-CM | POA: Diagnosis not present

## 2023-02-12 DIAGNOSIS — N181 Chronic kidney disease, stage 1: Secondary | ICD-10-CM | POA: Diagnosis not present

## 2023-02-12 DIAGNOSIS — R5383 Other fatigue: Secondary | ICD-10-CM | POA: Diagnosis not present

## 2023-02-12 DIAGNOSIS — B351 Tinea unguium: Secondary | ICD-10-CM | POA: Diagnosis not present

## 2023-02-17 DIAGNOSIS — F411 Generalized anxiety disorder: Secondary | ICD-10-CM | POA: Diagnosis not present

## 2023-02-17 DIAGNOSIS — Z1331 Encounter for screening for depression: Secondary | ICD-10-CM | POA: Diagnosis not present

## 2023-02-17 DIAGNOSIS — R269 Unspecified abnormalities of gait and mobility: Secondary | ICD-10-CM | POA: Diagnosis not present

## 2023-02-17 DIAGNOSIS — M6281 Muscle weakness (generalized): Secondary | ICD-10-CM | POA: Diagnosis not present

## 2023-02-17 DIAGNOSIS — F331 Major depressive disorder, recurrent, moderate: Secondary | ICD-10-CM | POA: Diagnosis not present

## 2023-02-17 DIAGNOSIS — F4321 Adjustment disorder with depressed mood: Secondary | ICD-10-CM | POA: Diagnosis not present

## 2023-02-17 DIAGNOSIS — M25562 Pain in left knee: Secondary | ICD-10-CM | POA: Diagnosis not present

## 2023-02-24 DIAGNOSIS — M25562 Pain in left knee: Secondary | ICD-10-CM | POA: Diagnosis not present

## 2023-02-24 DIAGNOSIS — R269 Unspecified abnormalities of gait and mobility: Secondary | ICD-10-CM | POA: Diagnosis not present

## 2023-02-24 DIAGNOSIS — M6281 Muscle weakness (generalized): Secondary | ICD-10-CM | POA: Diagnosis not present

## 2023-02-26 DIAGNOSIS — M25562 Pain in left knee: Secondary | ICD-10-CM | POA: Diagnosis not present

## 2023-02-26 DIAGNOSIS — R269 Unspecified abnormalities of gait and mobility: Secondary | ICD-10-CM | POA: Diagnosis not present

## 2023-02-26 DIAGNOSIS — M6281 Muscle weakness (generalized): Secondary | ICD-10-CM | POA: Diagnosis not present

## 2023-03-03 DIAGNOSIS — F331 Major depressive disorder, recurrent, moderate: Secondary | ICD-10-CM | POA: Diagnosis not present

## 2023-03-03 DIAGNOSIS — Z1331 Encounter for screening for depression: Secondary | ICD-10-CM | POA: Diagnosis not present

## 2023-03-03 DIAGNOSIS — M25562 Pain in left knee: Secondary | ICD-10-CM | POA: Diagnosis not present

## 2023-03-03 DIAGNOSIS — F4321 Adjustment disorder with depressed mood: Secondary | ICD-10-CM | POA: Diagnosis not present

## 2023-03-03 DIAGNOSIS — F411 Generalized anxiety disorder: Secondary | ICD-10-CM | POA: Diagnosis not present

## 2023-03-03 DIAGNOSIS — R269 Unspecified abnormalities of gait and mobility: Secondary | ICD-10-CM | POA: Diagnosis not present

## 2023-03-03 DIAGNOSIS — M6281 Muscle weakness (generalized): Secondary | ICD-10-CM | POA: Diagnosis not present

## 2023-03-05 ENCOUNTER — Encounter: Payer: Self-pay | Admitting: Nurse Practitioner

## 2023-03-05 ENCOUNTER — Ambulatory Visit: Payer: PPO | Attending: Nurse Practitioner | Admitting: Nurse Practitioner

## 2023-03-05 VITALS — BP 110/72 | HR 58 | Ht 67.0 in | Wt 186.0 lb

## 2023-03-05 DIAGNOSIS — I10 Essential (primary) hypertension: Secondary | ICD-10-CM

## 2023-03-05 DIAGNOSIS — M25562 Pain in left knee: Secondary | ICD-10-CM | POA: Diagnosis not present

## 2023-03-05 DIAGNOSIS — I2510 Atherosclerotic heart disease of native coronary artery without angina pectoris: Secondary | ICD-10-CM

## 2023-03-05 DIAGNOSIS — E785 Hyperlipidemia, unspecified: Secondary | ICD-10-CM | POA: Diagnosis not present

## 2023-03-05 DIAGNOSIS — R269 Unspecified abnormalities of gait and mobility: Secondary | ICD-10-CM | POA: Diagnosis not present

## 2023-03-05 DIAGNOSIS — M6281 Muscle weakness (generalized): Secondary | ICD-10-CM | POA: Diagnosis not present

## 2023-03-05 NOTE — Patient Instructions (Addendum)

## 2023-03-05 NOTE — Progress Notes (Signed)
Cardiology Office Note:  .   Date:  03/05/2023 ID:  [name], DOB [birth year], MRN [id number] PCP: Sheela Stack  [facility] Providers Cardiologist:  Nona Dell, MD    History of Present Illness: .   Melanie Mathews is a 78 y.o. female with a PMH of nonobstructive CAD, HLD, HTN, T2DM, who presents today for 1 year follow-up.   Previous cardiovascular history includes cardiac catheterization in 2020 that revealed 40% stenosis along proximal LAD with FFR of 0.85, mid LAD lesion 30%, with distal LAD lesion at 50%, otherwise angiographically normal coronary arteries.  Last seen by Cadence Lorna Few on September 16, 2021.  She was overall doing well at the time.  Noted occasional palpitations, denied any chest pain or shortness of breath.  Today she presents for overdue 1 year follow-up.  She states she did have some chest pain after she ran out of her medications a while ago, no longer experiencing this as she is returned to taking her medicines.  Overall doing well from a cardiac perspective.  She is currently undergoing therapy for her knee and shoulder. Denies any recent/active chest pain, shortness of breath, palpitations, syncope, presyncope, dizziness, orthopnea, PND, swelling or significant weight changes, acute bleeding, or claudication.  ROS: Negative.  See HPI.  Studies Reviewed: Marland Kitchen    EKG: EKG Interpretation Date/Time:  Thursday March 05 2023 15:44:13 EST Ventricular Rate:  60 PR Interval:  162 QRS Duration:  72 QT Interval:  418 QTC Calculation: 418 R Axis:   19  Text Interpretation: Normal sinus rhythm Low voltage QRS When compared with ECG of 25-Aug-2018 11:30, No significant change was found Confirmed by Sharlene Dory [phone]) on 03/05/2023 3:51:26 PM   LHC 08/2018: Prox LAD lesion is 40% stenosed. = DFR 0.91 with an FFR 0.85. Mid LAD lesion is 30% stenosed. Dist LAD lesion is 50% stenosed. Otherwise angiographically normal coronary  arteries with a left dominant system. Normal LVEDP   SUMMARY Angiographically minimal very proximal LAD lesion that surprisingly was borderline by DFR and moderate stenosis by FFR (0.85 --it is possible that FFR is considered from the distal vessel that this would have been an positive if measured at the apex) --> not physiologically significant by FFR therefore no PCI Otherwise minimal CAD with left dominant system. Normal LVEDP     RECOMMENDATIONS Discharge home after bed rest per PCI protocol based on extra heparin given for FFR Follow-up with Dr. Purvis Sheffield. Recommend aggressive risk factor modification and consider treatment with antispasm medications as this lesion would be prone to spasm   CCTA 07/2018 IMPRESSION: 1. Coronary artery calcium score 118 Agatston units. This places the patient in the 72nd percentile for age and gender, suggesting intermediate risk for future cardiac events.   2. Possible moderate stenosis in the proximal LAD. Will send for FFR to assess hemodynamic significance.  IMPRESSION: The proximal LAD stenosis appears hemodynamically significant. Recommend cardiac cath.  Echocardiogram 07/2018: 1. The left ventricle has normal systolic function with an ejection  fraction of 60-65%. The cavity size was normal. Left ventricular diastolic  Doppler parameters are consistent with impaired relaxation. Indeterminate  filling pressures.   2. The right ventricle has normal systolic function. The cavity was  normal. There is no increase in right ventricular wall thickness.   3. The mitral valve is grossly normal.   4. The tricuspid valve is grossly normal.   5. The aortic valve was not well visualized. Aortic valve regurgitation  is  mild by color flow Doppler.   6. The aorta is normal in size and structure.   7. The interatrial septum was not assessed.   8. Technically difficult study due to body habitus.    Physical Exam:   VS:  BP 110/72   Pulse (!) 58    Ht 5\' 7"  (1.702 m)   Wt 186 lb (84.4 kg)   SpO2 97%   BMI 29.13 kg/m    Wt Readings from Last 3 Encounters:  03/05/23 186 lb (84.4 kg)  09/16/21 195 lb (88.5 kg)  11/27/20 219 lb 14.4 oz (99.7 kg)    GEN: Well nourished, well developed in no acute distress NECK: No JVD; No carotid bruits CARDIAC: S1/S2, RRR, no murmurs, rubs, gallops RESPIRATORY:  Clear to auscultation without rales, wheezing or rhonchi  ABDOMEN: Soft, non-tender, non-distended EXTREMITIES:  No edema; No deformity   ASSESSMENT AND PLAN: .    1.  Nonobstructive CAD Denies any recent/active chest pain.  No indication for ischemic evaluation at this time.  C heart cath report from 2020 noted above.  Continue current medication regimen. Heart healthy diet and regular cardiovascular exercise encouraged.   2.  Hypertension Blood pressure stable. Discussed to monitor BP at home at least 2 hours after medications and sitting for 5-10 minutes.  No medication changes at this time. Heart healthy diet and regular cardiovascular exercise encouraged.   3.  Hyperlipidemia No recent labs on file.  Will request labs from PCP's office.  Continue current medication regimen.  Continue follow-up with PCP. Heart healthy diet and regular cardiovascular exercise encouraged.    Dispo: Follow-up with Dr. Diona Browner or APP in 1 year or sooner anything changes.  Signed, Sharlene Dory, NP

## 2023-03-06 ENCOUNTER — Encounter: Payer: Self-pay | Admitting: Physician Assistant

## 2023-03-13 DIAGNOSIS — M25562 Pain in left knee: Secondary | ICD-10-CM | POA: Diagnosis not present

## 2023-03-13 DIAGNOSIS — R269 Unspecified abnormalities of gait and mobility: Secondary | ICD-10-CM | POA: Diagnosis not present

## 2023-03-13 DIAGNOSIS — M6281 Muscle weakness (generalized): Secondary | ICD-10-CM | POA: Diagnosis not present

## 2023-03-17 DIAGNOSIS — M6281 Muscle weakness (generalized): Secondary | ICD-10-CM | POA: Diagnosis not present

## 2023-03-17 DIAGNOSIS — R269 Unspecified abnormalities of gait and mobility: Secondary | ICD-10-CM | POA: Diagnosis not present

## 2023-03-17 DIAGNOSIS — M25562 Pain in left knee: Secondary | ICD-10-CM | POA: Diagnosis not present

## 2023-03-19 DIAGNOSIS — R269 Unspecified abnormalities of gait and mobility: Secondary | ICD-10-CM | POA: Diagnosis not present

## 2023-03-19 DIAGNOSIS — M6281 Muscle weakness (generalized): Secondary | ICD-10-CM | POA: Diagnosis not present

## 2023-03-19 DIAGNOSIS — M25562 Pain in left knee: Secondary | ICD-10-CM | POA: Diagnosis not present

## 2023-03-24 DIAGNOSIS — M6281 Muscle weakness (generalized): Secondary | ICD-10-CM | POA: Diagnosis not present

## 2023-03-24 DIAGNOSIS — R269 Unspecified abnormalities of gait and mobility: Secondary | ICD-10-CM | POA: Diagnosis not present

## 2023-03-24 DIAGNOSIS — M25562 Pain in left knee: Secondary | ICD-10-CM | POA: Diagnosis not present

## 2023-03-26 DIAGNOSIS — M6281 Muscle weakness (generalized): Secondary | ICD-10-CM | POA: Diagnosis not present

## 2023-03-26 DIAGNOSIS — M25562 Pain in left knee: Secondary | ICD-10-CM | POA: Diagnosis not present

## 2023-03-26 DIAGNOSIS — R269 Unspecified abnormalities of gait and mobility: Secondary | ICD-10-CM | POA: Diagnosis not present

## 2023-03-30 DIAGNOSIS — M25562 Pain in left knee: Secondary | ICD-10-CM | POA: Diagnosis not present

## 2023-03-30 DIAGNOSIS — R269 Unspecified abnormalities of gait and mobility: Secondary | ICD-10-CM | POA: Diagnosis not present

## 2023-03-30 DIAGNOSIS — M6281 Muscle weakness (generalized): Secondary | ICD-10-CM | POA: Diagnosis not present

## 2023-04-03 DIAGNOSIS — M47817 Spondylosis without myelopathy or radiculopathy, lumbosacral region: Secondary | ICD-10-CM | POA: Diagnosis not present

## 2023-04-03 DIAGNOSIS — M160 Bilateral primary osteoarthritis of hip: Secondary | ICD-10-CM | POA: Diagnosis not present

## 2023-04-03 DIAGNOSIS — F4321 Adjustment disorder with depressed mood: Secondary | ICD-10-CM | POA: Diagnosis not present

## 2023-04-03 DIAGNOSIS — F331 Major depressive disorder, recurrent, moderate: Secondary | ICD-10-CM | POA: Diagnosis not present

## 2023-04-03 DIAGNOSIS — Z6832 Body mass index (BMI) 32.0-32.9, adult: Secondary | ICD-10-CM | POA: Diagnosis not present

## 2023-04-03 DIAGNOSIS — F411 Generalized anxiety disorder: Secondary | ICD-10-CM | POA: Diagnosis not present

## 2023-04-03 DIAGNOSIS — M545 Low back pain, unspecified: Secondary | ICD-10-CM | POA: Diagnosis not present

## 2023-04-03 DIAGNOSIS — M47816 Spondylosis without myelopathy or radiculopathy, lumbar region: Secondary | ICD-10-CM | POA: Diagnosis not present

## 2023-04-03 DIAGNOSIS — M25551 Pain in right hip: Secondary | ICD-10-CM | POA: Diagnosis not present

## 2023-04-03 DIAGNOSIS — M4316 Spondylolisthesis, lumbar region: Secondary | ICD-10-CM | POA: Diagnosis not present

## 2023-04-03 DIAGNOSIS — M5459 Other low back pain: Secondary | ICD-10-CM | POA: Diagnosis not present

## 2023-04-03 DIAGNOSIS — M25552 Pain in left hip: Secondary | ICD-10-CM | POA: Diagnosis not present

## 2023-04-07 DIAGNOSIS — F4321 Adjustment disorder with depressed mood: Secondary | ICD-10-CM | POA: Diagnosis not present

## 2023-04-07 DIAGNOSIS — F331 Major depressive disorder, recurrent, moderate: Secondary | ICD-10-CM | POA: Diagnosis not present

## 2023-04-08 DIAGNOSIS — R269 Unspecified abnormalities of gait and mobility: Secondary | ICD-10-CM | POA: Diagnosis not present

## 2023-04-08 DIAGNOSIS — M25562 Pain in left knee: Secondary | ICD-10-CM | POA: Diagnosis not present

## 2023-04-08 DIAGNOSIS — M6281 Muscle weakness (generalized): Secondary | ICD-10-CM | POA: Diagnosis not present

## 2023-04-23 DIAGNOSIS — B351 Tinea unguium: Secondary | ICD-10-CM | POA: Diagnosis not present

## 2023-04-23 DIAGNOSIS — M545 Low back pain, unspecified: Secondary | ICD-10-CM | POA: Diagnosis not present

## 2023-04-23 DIAGNOSIS — E1142 Type 2 diabetes mellitus with diabetic polyneuropathy: Secondary | ICD-10-CM | POA: Diagnosis not present

## 2023-04-23 DIAGNOSIS — M79675 Pain in left toe(s): Secondary | ICD-10-CM | POA: Diagnosis not present

## 2023-04-23 DIAGNOSIS — M79674 Pain in right toe(s): Secondary | ICD-10-CM | POA: Diagnosis not present

## 2023-04-23 DIAGNOSIS — L84 Corns and callosities: Secondary | ICD-10-CM | POA: Diagnosis not present

## 2023-04-27 DIAGNOSIS — D649 Anemia, unspecified: Secondary | ICD-10-CM | POA: Diagnosis not present

## 2023-04-27 DIAGNOSIS — E1165 Type 2 diabetes mellitus with hyperglycemia: Secondary | ICD-10-CM | POA: Diagnosis not present

## 2023-04-27 DIAGNOSIS — R531 Weakness: Secondary | ICD-10-CM | POA: Diagnosis not present

## 2023-04-27 DIAGNOSIS — Z13220 Encounter for screening for lipoid disorders: Secondary | ICD-10-CM | POA: Diagnosis not present

## 2023-04-27 DIAGNOSIS — M25561 Pain in right knee: Secondary | ICD-10-CM | POA: Diagnosis not present

## 2023-04-27 DIAGNOSIS — M109 Gout, unspecified: Secondary | ICD-10-CM | POA: Diagnosis not present

## 2023-04-27 DIAGNOSIS — N182 Chronic kidney disease, stage 2 (mild): Secondary | ICD-10-CM | POA: Diagnosis not present

## 2023-04-27 DIAGNOSIS — D519 Vitamin B12 deficiency anemia, unspecified: Secondary | ICD-10-CM | POA: Diagnosis not present

## 2023-04-27 DIAGNOSIS — D529 Folate deficiency anemia, unspecified: Secondary | ICD-10-CM | POA: Diagnosis not present

## 2023-04-27 DIAGNOSIS — Z1329 Encounter for screening for other suspected endocrine disorder: Secondary | ICD-10-CM | POA: Diagnosis not present

## 2023-05-01 DIAGNOSIS — I10 Essential (primary) hypertension: Secondary | ICD-10-CM | POA: Diagnosis not present

## 2023-05-01 DIAGNOSIS — Z6832 Body mass index (BMI) 32.0-32.9, adult: Secondary | ICD-10-CM | POA: Diagnosis not present

## 2023-05-01 DIAGNOSIS — E1165 Type 2 diabetes mellitus with hyperglycemia: Secondary | ICD-10-CM | POA: Diagnosis not present

## 2023-05-01 DIAGNOSIS — Z1331 Encounter for screening for depression: Secondary | ICD-10-CM | POA: Diagnosis not present

## 2023-05-01 DIAGNOSIS — N181 Chronic kidney disease, stage 1: Secondary | ICD-10-CM | POA: Diagnosis not present

## 2023-05-01 DIAGNOSIS — R4582 Worries: Secondary | ICD-10-CM | POA: Diagnosis not present

## 2023-05-06 DIAGNOSIS — M545 Low back pain, unspecified: Secondary | ICD-10-CM | POA: Diagnosis not present

## 2023-05-06 DIAGNOSIS — R269 Unspecified abnormalities of gait and mobility: Secondary | ICD-10-CM | POA: Diagnosis not present

## 2023-05-06 DIAGNOSIS — M6281 Muscle weakness (generalized): Secondary | ICD-10-CM | POA: Diagnosis not present

## 2023-05-11 DIAGNOSIS — M545 Low back pain, unspecified: Secondary | ICD-10-CM | POA: Diagnosis not present

## 2023-05-11 DIAGNOSIS — M6281 Muscle weakness (generalized): Secondary | ICD-10-CM | POA: Diagnosis not present

## 2023-05-11 DIAGNOSIS — R269 Unspecified abnormalities of gait and mobility: Secondary | ICD-10-CM | POA: Diagnosis not present

## 2023-05-14 DIAGNOSIS — M545 Low back pain, unspecified: Secondary | ICD-10-CM | POA: Diagnosis not present

## 2023-05-14 DIAGNOSIS — M6281 Muscle weakness (generalized): Secondary | ICD-10-CM | POA: Diagnosis not present

## 2023-05-14 DIAGNOSIS — R269 Unspecified abnormalities of gait and mobility: Secondary | ICD-10-CM | POA: Diagnosis not present

## 2023-05-19 DIAGNOSIS — M545 Low back pain, unspecified: Secondary | ICD-10-CM | POA: Diagnosis not present

## 2023-05-19 DIAGNOSIS — R269 Unspecified abnormalities of gait and mobility: Secondary | ICD-10-CM | POA: Diagnosis not present

## 2023-05-19 DIAGNOSIS — M6281 Muscle weakness (generalized): Secondary | ICD-10-CM | POA: Diagnosis not present

## 2023-05-21 DIAGNOSIS — M6281 Muscle weakness (generalized): Secondary | ICD-10-CM | POA: Diagnosis not present

## 2023-05-21 DIAGNOSIS — R269 Unspecified abnormalities of gait and mobility: Secondary | ICD-10-CM | POA: Diagnosis not present

## 2023-05-21 DIAGNOSIS — M545 Low back pain, unspecified: Secondary | ICD-10-CM | POA: Diagnosis not present

## 2023-05-26 DIAGNOSIS — M6281 Muscle weakness (generalized): Secondary | ICD-10-CM | POA: Diagnosis not present

## 2023-05-26 DIAGNOSIS — M545 Low back pain, unspecified: Secondary | ICD-10-CM | POA: Diagnosis not present

## 2023-05-26 DIAGNOSIS — R269 Unspecified abnormalities of gait and mobility: Secondary | ICD-10-CM | POA: Diagnosis not present

## 2023-05-27 DIAGNOSIS — M47816 Spondylosis without myelopathy or radiculopathy, lumbar region: Secondary | ICD-10-CM | POA: Diagnosis not present

## 2023-05-27 DIAGNOSIS — M5136 Other intervertebral disc degeneration, lumbar region with discogenic back pain only: Secondary | ICD-10-CM | POA: Diagnosis not present

## 2023-05-27 DIAGNOSIS — M533 Sacrococcygeal disorders, not elsewhere classified: Secondary | ICD-10-CM | POA: Diagnosis not present

## 2023-05-28 DIAGNOSIS — R269 Unspecified abnormalities of gait and mobility: Secondary | ICD-10-CM | POA: Diagnosis not present

## 2023-05-28 DIAGNOSIS — M6281 Muscle weakness (generalized): Secondary | ICD-10-CM | POA: Diagnosis not present

## 2023-05-28 DIAGNOSIS — M545 Low back pain, unspecified: Secondary | ICD-10-CM | POA: Diagnosis not present

## 2023-06-02 DIAGNOSIS — M545 Low back pain, unspecified: Secondary | ICD-10-CM | POA: Diagnosis not present

## 2023-06-02 DIAGNOSIS — M6281 Muscle weakness (generalized): Secondary | ICD-10-CM | POA: Diagnosis not present

## 2023-06-02 DIAGNOSIS — R269 Unspecified abnormalities of gait and mobility: Secondary | ICD-10-CM | POA: Diagnosis not present

## 2023-06-17 DIAGNOSIS — R269 Unspecified abnormalities of gait and mobility: Secondary | ICD-10-CM | POA: Diagnosis not present

## 2023-06-17 DIAGNOSIS — M6281 Muscle weakness (generalized): Secondary | ICD-10-CM | POA: Diagnosis not present

## 2023-06-17 DIAGNOSIS — M545 Low back pain, unspecified: Secondary | ICD-10-CM | POA: Diagnosis not present

## 2023-06-22 DIAGNOSIS — M533 Sacrococcygeal disorders, not elsewhere classified: Secondary | ICD-10-CM | POA: Diagnosis not present

## 2023-06-26 DIAGNOSIS — M545 Low back pain, unspecified: Secondary | ICD-10-CM | POA: Diagnosis not present

## 2023-06-26 DIAGNOSIS — M6281 Muscle weakness (generalized): Secondary | ICD-10-CM | POA: Diagnosis not present

## 2023-06-26 DIAGNOSIS — R269 Unspecified abnormalities of gait and mobility: Secondary | ICD-10-CM | POA: Diagnosis not present

## 2023-06-30 DIAGNOSIS — M545 Low back pain, unspecified: Secondary | ICD-10-CM | POA: Diagnosis not present

## 2023-06-30 DIAGNOSIS — R269 Unspecified abnormalities of gait and mobility: Secondary | ICD-10-CM | POA: Diagnosis not present

## 2023-06-30 DIAGNOSIS — M6281 Muscle weakness (generalized): Secondary | ICD-10-CM | POA: Diagnosis not present

## 2023-07-02 DIAGNOSIS — R269 Unspecified abnormalities of gait and mobility: Secondary | ICD-10-CM | POA: Diagnosis not present

## 2023-07-02 DIAGNOSIS — M545 Low back pain, unspecified: Secondary | ICD-10-CM | POA: Diagnosis not present

## 2023-07-02 DIAGNOSIS — B351 Tinea unguium: Secondary | ICD-10-CM | POA: Diagnosis not present

## 2023-07-02 DIAGNOSIS — M79676 Pain in unspecified toe(s): Secondary | ICD-10-CM | POA: Diagnosis not present

## 2023-07-02 DIAGNOSIS — L84 Corns and callosities: Secondary | ICD-10-CM | POA: Diagnosis not present

## 2023-07-02 DIAGNOSIS — E1142 Type 2 diabetes mellitus with diabetic polyneuropathy: Secondary | ICD-10-CM | POA: Diagnosis not present

## 2023-07-02 DIAGNOSIS — M6281 Muscle weakness (generalized): Secondary | ICD-10-CM | POA: Diagnosis not present

## 2023-07-09 DIAGNOSIS — M47816 Spondylosis without myelopathy or radiculopathy, lumbar region: Secondary | ICD-10-CM | POA: Diagnosis not present

## 2023-07-09 DIAGNOSIS — M5136 Other intervertebral disc degeneration, lumbar region with discogenic back pain only: Secondary | ICD-10-CM | POA: Diagnosis not present

## 2023-07-09 DIAGNOSIS — Z1330 Encounter for screening examination for mental health and behavioral disorders, unspecified: Secondary | ICD-10-CM | POA: Diagnosis not present

## 2023-07-09 DIAGNOSIS — M461 Sacroiliitis, not elsewhere classified: Secondary | ICD-10-CM | POA: Diagnosis not present

## 2023-07-09 DIAGNOSIS — M48061 Spinal stenosis, lumbar region without neurogenic claudication: Secondary | ICD-10-CM | POA: Diagnosis not present

## 2023-07-15 DIAGNOSIS — M6281 Muscle weakness (generalized): Secondary | ICD-10-CM | POA: Diagnosis not present

## 2023-07-15 DIAGNOSIS — R269 Unspecified abnormalities of gait and mobility: Secondary | ICD-10-CM | POA: Diagnosis not present

## 2023-07-15 DIAGNOSIS — M545 Low back pain, unspecified: Secondary | ICD-10-CM | POA: Diagnosis not present

## 2023-07-22 DIAGNOSIS — M6281 Muscle weakness (generalized): Secondary | ICD-10-CM | POA: Diagnosis not present

## 2023-07-22 DIAGNOSIS — R269 Unspecified abnormalities of gait and mobility: Secondary | ICD-10-CM | POA: Diagnosis not present

## 2023-07-22 DIAGNOSIS — M533 Sacrococcygeal disorders, not elsewhere classified: Secondary | ICD-10-CM | POA: Diagnosis not present

## 2023-07-22 DIAGNOSIS — W19XXXA Unspecified fall, initial encounter: Secondary | ICD-10-CM | POA: Diagnosis not present

## 2023-07-22 DIAGNOSIS — M545 Low back pain, unspecified: Secondary | ICD-10-CM | POA: Diagnosis not present

## 2023-07-22 DIAGNOSIS — Z6831 Body mass index (BMI) 31.0-31.9, adult: Secondary | ICD-10-CM | POA: Diagnosis not present

## 2023-07-23 DIAGNOSIS — N182 Chronic kidney disease, stage 2 (mild): Secondary | ICD-10-CM | POA: Diagnosis not present

## 2023-07-23 DIAGNOSIS — E785 Hyperlipidemia, unspecified: Secondary | ICD-10-CM | POA: Diagnosis not present

## 2023-07-23 DIAGNOSIS — D649 Anemia, unspecified: Secondary | ICD-10-CM | POA: Diagnosis not present

## 2023-07-23 DIAGNOSIS — Z1329 Encounter for screening for other suspected endocrine disorder: Secondary | ICD-10-CM | POA: Diagnosis not present

## 2023-07-23 DIAGNOSIS — M109 Gout, unspecified: Secondary | ICD-10-CM | POA: Diagnosis not present

## 2023-07-23 DIAGNOSIS — E1165 Type 2 diabetes mellitus with hyperglycemia: Secondary | ICD-10-CM | POA: Diagnosis not present

## 2023-07-23 DIAGNOSIS — E162 Hypoglycemia, unspecified: Secondary | ICD-10-CM | POA: Diagnosis not present

## 2023-07-23 DIAGNOSIS — I10 Essential (primary) hypertension: Secondary | ICD-10-CM | POA: Diagnosis not present

## 2023-07-23 DIAGNOSIS — D529 Folate deficiency anemia, unspecified: Secondary | ICD-10-CM | POA: Diagnosis not present

## 2023-07-27 DIAGNOSIS — M461 Sacroiliitis, not elsewhere classified: Secondary | ICD-10-CM | POA: Diagnosis not present

## 2023-07-28 DIAGNOSIS — R609 Edema, unspecified: Secondary | ICD-10-CM | POA: Diagnosis not present

## 2023-07-28 DIAGNOSIS — E1165 Type 2 diabetes mellitus with hyperglycemia: Secondary | ICD-10-CM | POA: Diagnosis not present

## 2023-07-28 DIAGNOSIS — Z6831 Body mass index (BMI) 31.0-31.9, adult: Secondary | ICD-10-CM | POA: Diagnosis not present

## 2023-07-28 DIAGNOSIS — I10 Essential (primary) hypertension: Secondary | ICD-10-CM | POA: Diagnosis not present

## 2023-07-28 DIAGNOSIS — N181 Chronic kidney disease, stage 1: Secondary | ICD-10-CM | POA: Diagnosis not present

## 2023-08-04 DIAGNOSIS — L97511 Non-pressure chronic ulcer of other part of right foot limited to breakdown of skin: Secondary | ICD-10-CM | POA: Diagnosis not present

## 2023-08-05 DIAGNOSIS — M6281 Muscle weakness (generalized): Secondary | ICD-10-CM | POA: Diagnosis not present

## 2023-08-05 DIAGNOSIS — R269 Unspecified abnormalities of gait and mobility: Secondary | ICD-10-CM | POA: Diagnosis not present

## 2023-08-05 DIAGNOSIS — M545 Low back pain, unspecified: Secondary | ICD-10-CM | POA: Diagnosis not present

## 2023-08-12 DIAGNOSIS — R269 Unspecified abnormalities of gait and mobility: Secondary | ICD-10-CM | POA: Diagnosis not present

## 2023-08-12 DIAGNOSIS — M545 Low back pain, unspecified: Secondary | ICD-10-CM | POA: Diagnosis not present

## 2023-08-12 DIAGNOSIS — M6281 Muscle weakness (generalized): Secondary | ICD-10-CM | POA: Diagnosis not present

## 2023-09-07 DIAGNOSIS — M461 Sacroiliitis, not elsewhere classified: Secondary | ICD-10-CM | POA: Diagnosis not present

## 2023-09-07 DIAGNOSIS — M533 Sacrococcygeal disorders, not elsewhere classified: Secondary | ICD-10-CM | POA: Diagnosis not present

## 2023-09-07 DIAGNOSIS — M5136 Other intervertebral disc degeneration, lumbar region with discogenic back pain only: Secondary | ICD-10-CM | POA: Diagnosis not present

## 2023-09-07 DIAGNOSIS — M47816 Spondylosis without myelopathy or radiculopathy, lumbar region: Secondary | ICD-10-CM | POA: Diagnosis not present

## 2023-09-09 ENCOUNTER — Ambulatory Visit: Admitting: Urology

## 2023-09-10 DIAGNOSIS — L97511 Non-pressure chronic ulcer of other part of right foot limited to breakdown of skin: Secondary | ICD-10-CM | POA: Diagnosis not present

## 2023-10-01 DIAGNOSIS — M79674 Pain in right toe(s): Secondary | ICD-10-CM | POA: Diagnosis not present

## 2023-10-01 DIAGNOSIS — L84 Corns and callosities: Secondary | ICD-10-CM | POA: Diagnosis not present

## 2023-10-01 DIAGNOSIS — E1142 Type 2 diabetes mellitus with diabetic polyneuropathy: Secondary | ICD-10-CM | POA: Diagnosis not present

## 2023-10-01 DIAGNOSIS — B351 Tinea unguium: Secondary | ICD-10-CM | POA: Diagnosis not present

## 2023-10-01 DIAGNOSIS — M79675 Pain in left toe(s): Secondary | ICD-10-CM | POA: Diagnosis not present

## 2023-10-13 DIAGNOSIS — J209 Acute bronchitis, unspecified: Secondary | ICD-10-CM | POA: Diagnosis not present

## 2023-10-13 DIAGNOSIS — Z20828 Contact with and (suspected) exposure to other viral communicable diseases: Secondary | ICD-10-CM | POA: Diagnosis not present

## 2023-10-13 DIAGNOSIS — Z2089 Contact with and (suspected) exposure to other communicable diseases: Secondary | ICD-10-CM | POA: Diagnosis not present

## 2023-10-13 DIAGNOSIS — Z6831 Body mass index (BMI) 31.0-31.9, adult: Secondary | ICD-10-CM | POA: Diagnosis not present

## 2023-10-19 ENCOUNTER — Ambulatory Visit: Admitting: Urology

## 2023-10-19 VITALS — BP 111/72 | HR 66

## 2023-10-19 DIAGNOSIS — N3281 Overactive bladder: Secondary | ICD-10-CM | POA: Diagnosis not present

## 2023-10-19 DIAGNOSIS — N3946 Mixed incontinence: Secondary | ICD-10-CM

## 2023-10-19 DIAGNOSIS — R32 Unspecified urinary incontinence: Secondary | ICD-10-CM | POA: Diagnosis not present

## 2023-10-19 LAB — URINALYSIS, ROUTINE W REFLEX MICROSCOPIC
Bilirubin, UA: NEGATIVE
Ketones, UA: NEGATIVE
Leukocytes,UA: NEGATIVE
Nitrite, UA: NEGATIVE
Protein,UA: NEGATIVE
RBC, UA: NEGATIVE
Specific Gravity, UA: 1.01 (ref 1.005–1.030)
Urobilinogen, Ur: 0.2 mg/dL (ref 0.2–1.0)
pH, UA: 6 (ref 5.0–7.5)

## 2023-10-19 MED ORDER — MIRABEGRON ER 50 MG PO TB24: 50.0000 mg | ORAL_TABLET | Freq: Every day | ORAL | 11 refills | Status: AC

## 2023-10-19 NOTE — Progress Notes (Signed)
 Bladder Scan completed today.  Patient can void prior to the bladder scan. Bladder scan result: 0  Performed By: [facility] LPN

## 2023-10-19 NOTE — Progress Notes (Signed)
 10/19/2023 1:43 PM   Melanie Mathews 04/07/1945 [id number]  Referring provider: Skillman, Katherine E, PA-C [address] [place],  [place] [address]  No chief complaint on file.   HPI:  New pt -   1) Urinary frequency, urgency - she has frequency and urgency. On myrbetriq 25 mg for several months. Not a lot of improvements. She did pelvic floor PT. She leaks with cough and sneeze but also has urgency and UUI. Will lose bladder. Has turn key. Limites activities. Stays home. No hematuria or dysuria. No HRT.  No GU surgery. Prior hysterectomy. No NG risk. No breast ca. 2020 CT - no stones. Nl GU report.     PMH: Past Medical History:  Diagnosis Date   Anxiety    CAD (coronary artery disease)    Mild to moderate proximal LAD disease 2020   Essential hypertension    Hernia    Hypothyroidism    Type 2 diabetes mellitus (HCC)     Surgical History: Past Surgical History:  Procedure Laterality Date   ABDOMINAL HYSTERECTOMY     CERVICAL FUSION     CHOLECYSTECTOMY     COLONOSCOPY     COLONOSCOPY  07/03/2011   Procedure: COLONOSCOPY;  Surgeon: Claudis RAYMOND Rivet, MD;  [facility];  Service: Endoscopy;  Laterality: N/A;  1030   ESOPHAGEAL DILATION N/A 04/19/2015   Procedure: ESOPHAGEAL DILATION;  Surgeon: Claudis RAYMOND Rivet, MD;  [facility];  Service: Endoscopy;  Laterality: N/A;   ESOPHAGOGASTRODUODENOSCOPY N/A 04/19/2015   Procedure: ESOPHAGOGASTRODUODENOSCOPY (EGD);  Surgeon: Claudis RAYMOND Rivet, MD;  [facility];  Service: Endoscopy;  Laterality: N/A;  2:05   KNEE SURGERY     Arthroscopic   LEFT HEART CATH AND CORONARY ANGIOGRAPHY N/A 08/25/2018   Procedure: LEFT HEART CATH AND CORONARY ANGIOGRAPHY;  Surgeon: Anner Alm LELON, MD;  Location: [facility] INVASIVE CV LAB;  Service: Cardiovascular;  Laterality: N/A;   UPPER GASTROINTESTINAL ENDOSCOPY      Home Medications:  Allergies as of 10/19/2023       Reactions   Elemental Sulfur Itching, Swelling    Adhesive [tape] Rash        Medication List        Accurate as of October 19, 2023  1:43 PM. If you have any questions, ask your nurse or doctor.          acetaminophen  650 MG CR tablet Commonly known as: TYLENOL  Take 1,300 mg by mouth every 8 (eight) hours as needed for pain.   allopurinol 300 MG tablet Commonly known as: ZYLOPRIM Take 300 mg by mouth daily.   Calcium-Vitamin D3 500-400 MG-UNIT Tabs Take 1 tablet by mouth daily.   colchicine  0.6 MG tablet Take 0.6 mg by mouth as needed.   DULoxetine 60 MG capsule Commonly known as: CYMBALTA Take 30 mg by mouth 2 (two) times daily.   DULoxetine 30 MG capsule Commonly known as: CYMBALTA Take 30 mg by mouth daily.   esomeprazole  40 MG capsule Commonly known as: NEXIUM  TAKE ONE CAPSULE BY MOUTH TWICE DAILY   furosemide 40 MG tablet Commonly known as: LASIX Take 40 mg by mouth daily.   insulin glargine 100 UNIT/ML injection Commonly known as: LANTUS Inject 22 Units into the skin daily.   isosorbide  mononitrate 30 MG 24 hr tablet Commonly known as: IMDUR  TAKE 1 TABLET BY MOUTH EVERY MORNING *REFILL REQUEST*   Jardiance 10 MG Tabs tablet Generic drug: empagliflozin Take 10 mg by mouth  daily.   levothyroxine 125 MCG tablet Commonly known as: SYNTHROID Take 112 mcg by mouth daily before breakfast.   levothyroxine 112 MCG tablet Commonly known as: SYNTHROID Take 112 mcg by mouth daily.   losartan 50 MG tablet Commonly known as: COZAAR Take 50 mg by mouth every morning.   metoprolol  tartrate 50 MG tablet Commonly known as: LOPRESSOR  Take 50 mg by mouth 2 (two) times a day.   pregabalin 50 MG capsule Commonly known as: LYRICA Take 50 mg by mouth 2 (two) times daily.   PROBIOTIC-10 PO Take 1 tablet by mouth daily.   simvastatin 40 MG tablet Commonly known as: ZOCOR Take 40 mg by mouth at bedtime.   spironolactone 25 MG tablet Commonly known as: ALDACTONE Take 25 mg by mouth daily.    traMADol 50 MG tablet Commonly known as: ULTRAM Take 50 mg by mouth twice daily, may take a third 50 mg dose mid day as needed for pain   Turmeric 500 MG Caps Take 500 mg by mouth 2 (two) times daily.   ZINC 15 PO Take 15 mg by mouth daily.        Allergies:  Allergies  Allergen Reactions   Elemental Sulfur Itching and Swelling   Adhesive [Tape] Rash    Family History: Family History  Problem Relation Age of Onset   Arthritis Mother    Pancreatic cancer Father    Arthritis Sister    COPD Sister    Diabetes Sister     Social History:  reports that she quit smoking about 32 years ago. Her smoking use included cigarettes. She started smoking about 35 years ago. She has a 1.5 pack-year smoking history. She has never used smokeless tobacco. She reports that she does not drink alcohol  and does not use drugs.   Physical Exam: There were no vitals taken for this visit.  Constitutional:  Alert and oriented, No acute distress. HEENT: [place] AT, moist mucus membranes.  Trachea midline, no masses. Cardiovascular: No clubbing, cyanosis, or edema. Respiratory: Normal respiratory effort, no increased work of breathing. GI: Abdomen is soft, nontender, nondistended, no abdominal masses GU: No CVA tenderness Skin: No rashes, bruises or suspicious lesions. Neurologic: Grossly intact, no focal deficits, moving all 4 extremities. Psychiatric: Normal mood and affect.  Laboratory Data: Lab Results  Component Value Date   WBC 6.6 08/23/2018   HGB 12.4 08/23/2018   HCT 39.7 08/23/2018   MCV 90.4 08/23/2018   PLT 242 08/23/2018    Lab Results  Component Value Date   CREATININE 1.15 (H) 08/18/2018    No results found for: PSA  No results found for: TESTOSTERONE  No results found for: HGBA1C  Urinalysis No results found for: COLORURINE, APPEARANCEUR, LABSPEC, PHURINE, GLUCOSEU, HGBUR, BILIRUBINUR, KETONESUR, PROTEINUR, UROBILINOGEN, NITRITE,  LEUKOCYTESUR  No results found for: LABMICR, WBCUA, RBCUA, LABEPIT, MUCUS, BACTERIA  Pertinent Imaging: N/a   Assessment & Plan:    Urgency, mixed incontinence - discussed TVE, BT, PT, combo, PTNS, ecoin, and botox. Will increase Myrbetriq to 50 mg. Also will add vesicare 5 mg if needed - discussed nature r/b of anticholinergics vs beta 3.   No follow-ups on file.  Donnice Brooks, MD  [facility]  [address] [place], [place] [address] [phone]

## 2023-10-26 DIAGNOSIS — M533 Sacrococcygeal disorders, not elsewhere classified: Secondary | ICD-10-CM | POA: Diagnosis not present

## 2023-11-02 DIAGNOSIS — E785 Hyperlipidemia, unspecified: Secondary | ICD-10-CM | POA: Diagnosis not present

## 2023-11-02 DIAGNOSIS — M109 Gout, unspecified: Secondary | ICD-10-CM | POA: Diagnosis not present

## 2023-11-02 DIAGNOSIS — E039 Hypothyroidism, unspecified: Secondary | ICD-10-CM | POA: Diagnosis not present

## 2023-11-02 DIAGNOSIS — Z6831 Body mass index (BMI) 31.0-31.9, adult: Secondary | ICD-10-CM | POA: Diagnosis not present

## 2023-11-02 DIAGNOSIS — E1165 Type 2 diabetes mellitus with hyperglycemia: Secondary | ICD-10-CM | POA: Diagnosis not present

## 2023-11-02 DIAGNOSIS — I10 Essential (primary) hypertension: Secondary | ICD-10-CM | POA: Diagnosis not present

## 2023-11-02 DIAGNOSIS — N181 Chronic kidney disease, stage 1: Secondary | ICD-10-CM | POA: Diagnosis not present

## 2023-11-02 DIAGNOSIS — R609 Edema, unspecified: Secondary | ICD-10-CM | POA: Diagnosis not present

## 2023-11-19 DIAGNOSIS — D225 Melanocytic nevi of trunk: Secondary | ICD-10-CM | POA: Diagnosis not present

## 2023-11-19 DIAGNOSIS — L821 Other seborrheic keratosis: Secondary | ICD-10-CM | POA: Diagnosis not present

## 2023-11-19 DIAGNOSIS — M7130 Other bursal cyst, unspecified site: Secondary | ICD-10-CM | POA: Diagnosis not present

## 2023-11-19 DIAGNOSIS — D485 Neoplasm of uncertain behavior of skin: Secondary | ICD-10-CM | POA: Diagnosis not present

## 2023-11-23 DIAGNOSIS — H00014 Hordeolum externum left upper eyelid: Secondary | ICD-10-CM | POA: Diagnosis not present

## 2023-11-23 DIAGNOSIS — F419 Anxiety disorder, unspecified: Secondary | ICD-10-CM | POA: Diagnosis not present

## 2023-11-23 DIAGNOSIS — Z6831 Body mass index (BMI) 31.0-31.9, adult: Secondary | ICD-10-CM | POA: Diagnosis not present

## 2023-12-10 DIAGNOSIS — M79674 Pain in right toe(s): Secondary | ICD-10-CM | POA: Diagnosis not present

## 2023-12-10 DIAGNOSIS — M67471 Ganglion, right ankle and foot: Secondary | ICD-10-CM | POA: Diagnosis not present

## 2023-12-14 DIAGNOSIS — G894 Chronic pain syndrome: Secondary | ICD-10-CM | POA: Diagnosis not present

## 2023-12-14 DIAGNOSIS — M47816 Spondylosis without myelopathy or radiculopathy, lumbar region: Secondary | ICD-10-CM | POA: Diagnosis not present

## 2023-12-14 DIAGNOSIS — M5136 Other intervertebral disc degeneration, lumbar region with discogenic back pain only: Secondary | ICD-10-CM | POA: Diagnosis not present

## 2024-02-22 ENCOUNTER — Ambulatory Visit: Admitting: Urology

## 2024-08-22 ENCOUNTER — Ambulatory Visit: Admitting: Urology
